# Patient Record
Sex: Male | Born: 1950 | Race: White | Hispanic: No | Marital: Married | Smoking: Current some day smoker
Health system: Southern US, Community
[De-identification: ages and names within clinical notes are randomized; demographics above are authoritative.]

## PROBLEM LIST (undated history)

## (undated) DIAGNOSIS — G5600 Carpal tunnel syndrome, unspecified upper limb: Secondary | ICD-10-CM

## (undated) DIAGNOSIS — E785 Hyperlipidemia, unspecified: Secondary | ICD-10-CM

## (undated) DIAGNOSIS — M25561 Pain in right knee: Secondary | ICD-10-CM

## (undated) DIAGNOSIS — M069 Rheumatoid arthritis, unspecified: Secondary | ICD-10-CM

## (undated) DIAGNOSIS — I2510 Atherosclerotic heart disease of native coronary artery without angina pectoris: Secondary | ICD-10-CM

## (undated) DIAGNOSIS — Z86711 Personal history of pulmonary embolism: Secondary | ICD-10-CM

## (undated) DIAGNOSIS — C3490 Malignant neoplasm of unspecified part of unspecified bronchus or lung: Secondary | ICD-10-CM

## (undated) DIAGNOSIS — I10 Essential (primary) hypertension: Secondary | ICD-10-CM

## (undated) DIAGNOSIS — M25562 Pain in left knee: Secondary | ICD-10-CM

## (undated) DIAGNOSIS — I4891 Unspecified atrial fibrillation: Secondary | ICD-10-CM

## (undated) DIAGNOSIS — G8929 Other chronic pain: Secondary | ICD-10-CM

## (undated) DIAGNOSIS — K219 Gastro-esophageal reflux disease without esophagitis: Secondary | ICD-10-CM

## (undated) DIAGNOSIS — C801 Malignant (primary) neoplasm, unspecified: Secondary | ICD-10-CM

## (undated) DIAGNOSIS — D72829 Elevated white blood cell count, unspecified: Secondary | ICD-10-CM

## (undated) HISTORY — DX: Atherosclerotic heart disease of native coronary artery without angina pectoris: I25.10

## (undated) HISTORY — DX: Essential (primary) hypertension: I10

## (undated) HISTORY — DX: Pain in right knee: M25.561

## (undated) HISTORY — DX: Gastro-esophageal reflux disease without esophagitis: K21.9

## (undated) HISTORY — DX: Elevated white blood cell count, unspecified: D72.829

## (undated) HISTORY — PX: HEMORRHOID SURGERY: SHX153

## (undated) HISTORY — DX: Unspecified atrial fibrillation: I48.91

## (undated) HISTORY — DX: Pain in right knee: M25.562

## (undated) HISTORY — DX: Hyperlipidemia, unspecified: E78.5

## (undated) HISTORY — DX: Personal history of pulmonary embolism: Z86.711

---

## 2002-07-08 ENCOUNTER — Encounter: Payer: Self-pay | Admitting: *Deleted

## 2002-07-08 ENCOUNTER — Emergency Department (HOSPITAL_COMMUNITY): Admission: EM | Admit: 2002-07-08 | Discharge: 2002-07-08 | Payer: Self-pay | Admitting: *Deleted

## 2004-07-26 ENCOUNTER — Emergency Department (HOSPITAL_COMMUNITY): Admission: EM | Admit: 2004-07-26 | Discharge: 2004-07-26 | Payer: Self-pay | Admitting: Emergency Medicine

## 2007-02-02 ENCOUNTER — Emergency Department (HOSPITAL_COMMUNITY): Admission: EM | Admit: 2007-02-02 | Discharge: 2007-02-03 | Payer: Self-pay | Admitting: Emergency Medicine

## 2007-09-13 ENCOUNTER — Emergency Department (HOSPITAL_COMMUNITY): Admission: EM | Admit: 2007-09-13 | Discharge: 2007-09-13 | Payer: Self-pay | Admitting: Family Medicine

## 2009-11-28 ENCOUNTER — Ambulatory Visit: Payer: Self-pay | Admitting: Cardiology

## 2009-11-28 ENCOUNTER — Inpatient Hospital Stay (HOSPITAL_COMMUNITY): Admission: EM | Admit: 2009-11-28 | Discharge: 2009-12-06 | Payer: Self-pay | Admitting: Emergency Medicine

## 2009-11-28 ENCOUNTER — Ambulatory Visit: Payer: Self-pay | Admitting: Internal Medicine

## 2009-11-29 ENCOUNTER — Encounter: Payer: Self-pay | Admitting: Surgery

## 2009-11-29 ENCOUNTER — Ambulatory Visit: Payer: Self-pay | Admitting: Surgery

## 2009-11-29 ENCOUNTER — Ambulatory Visit: Payer: Self-pay | Admitting: Vascular Surgery

## 2009-11-30 HISTORY — PX: CORONARY ARTERY BYPASS GRAFT: SHX141

## 2009-12-02 ENCOUNTER — Encounter (INDEPENDENT_AMBULATORY_CARE_PROVIDER_SITE_OTHER): Payer: Self-pay | Admitting: Internal Medicine

## 2009-12-11 ENCOUNTER — Encounter: Payer: Self-pay | Admitting: Cardiology

## 2009-12-12 ENCOUNTER — Telehealth: Payer: Self-pay | Admitting: Cardiology

## 2009-12-25 DIAGNOSIS — K219 Gastro-esophageal reflux disease without esophagitis: Secondary | ICD-10-CM

## 2009-12-25 DIAGNOSIS — I10 Essential (primary) hypertension: Secondary | ICD-10-CM | POA: Insufficient documentation

## 2010-01-01 ENCOUNTER — Encounter: Payer: Self-pay | Admitting: Cardiology

## 2010-01-02 ENCOUNTER — Ambulatory Visit: Payer: Self-pay | Admitting: Surgery

## 2010-01-02 ENCOUNTER — Ambulatory Visit: Payer: Self-pay | Admitting: Cardiology

## 2010-01-02 ENCOUNTER — Encounter: Admission: RE | Admit: 2010-01-02 | Discharge: 2010-01-02 | Payer: Self-pay | Admitting: Surgery

## 2010-01-02 DIAGNOSIS — I2510 Atherosclerotic heart disease of native coronary artery without angina pectoris: Secondary | ICD-10-CM

## 2010-01-02 DIAGNOSIS — Z9189 Other specified personal risk factors, not elsewhere classified: Secondary | ICD-10-CM | POA: Insufficient documentation

## 2010-01-02 DIAGNOSIS — I4891 Unspecified atrial fibrillation: Secondary | ICD-10-CM

## 2010-01-02 DIAGNOSIS — Z9861 Coronary angioplasty status: Secondary | ICD-10-CM

## 2010-01-02 DIAGNOSIS — Z87891 Personal history of nicotine dependence: Secondary | ICD-10-CM | POA: Insufficient documentation

## 2010-01-08 ENCOUNTER — Telehealth: Payer: Self-pay | Admitting: Cardiology

## 2010-01-15 ENCOUNTER — Telehealth: Payer: Self-pay | Admitting: Cardiology

## 2010-01-19 ENCOUNTER — Telehealth: Payer: Self-pay | Admitting: Cardiology

## 2010-01-26 ENCOUNTER — Telehealth (INDEPENDENT_AMBULATORY_CARE_PROVIDER_SITE_OTHER): Payer: Self-pay | Admitting: *Deleted

## 2010-01-26 ENCOUNTER — Ambulatory Visit: Payer: Self-pay | Admitting: Cardiology

## 2010-01-26 DIAGNOSIS — I10 Essential (primary) hypertension: Secondary | ICD-10-CM | POA: Insufficient documentation

## 2010-01-29 LAB — CONVERTED CEMR LAB
Calcium: 8.8 mg/dL (ref 8.4–10.5)
Chloride: 105 meq/L (ref 96–112)
Creatinine, Ser: 1.3 mg/dL (ref 0.4–1.5)
Glucose, Bld: 112 mg/dL — ABNORMAL HIGH (ref 70–99)
Sodium: 142 meq/L (ref 135–145)

## 2010-02-05 ENCOUNTER — Telehealth: Payer: Self-pay | Admitting: Cardiology

## 2010-02-13 ENCOUNTER — Ambulatory Visit: Payer: Self-pay | Admitting: Cardiology

## 2010-02-13 ENCOUNTER — Telehealth: Payer: Self-pay | Admitting: Cardiology

## 2010-02-15 LAB — CONVERTED CEMR LAB
BUN: 14 mg/dL (ref 6–23)
CO2: 29 meq/L (ref 19–32)
Calcium: 8.7 mg/dL (ref 8.4–10.5)
Chloride: 104 meq/L (ref 96–112)
Creatinine, Ser: 1.2 mg/dL (ref 0.4–1.5)
GFR calc non Af Amer: 65.52 mL/min (ref >60)
Glucose, Bld: 94 mg/dL (ref 70–99)

## 2010-02-21 ENCOUNTER — Telehealth: Payer: Self-pay | Admitting: Cardiology

## 2010-02-23 ENCOUNTER — Telehealth: Payer: Self-pay | Admitting: Cardiology

## 2010-03-01 ENCOUNTER — Ambulatory Visit: Payer: Self-pay | Admitting: Cardiology

## 2010-03-04 LAB — CONVERTED CEMR LAB
CO2: 31 meq/L (ref 19–32)
Creatinine, Ser: 1.3 mg/dL (ref 0.4–1.5)

## 2010-03-08 ENCOUNTER — Encounter: Payer: Self-pay | Admitting: Cardiology

## 2010-03-27 ENCOUNTER — Ambulatory Visit: Payer: Self-pay | Admitting: Cardiology

## 2010-03-27 DIAGNOSIS — E785 Hyperlipidemia, unspecified: Secondary | ICD-10-CM | POA: Insufficient documentation

## 2010-04-09 ENCOUNTER — Telehealth: Payer: Self-pay | Admitting: Cardiology

## 2010-06-11 ENCOUNTER — Ambulatory Visit: Payer: Self-pay | Admitting: Physician Assistant

## 2010-06-11 DIAGNOSIS — M79609 Pain in unspecified limb: Secondary | ICD-10-CM

## 2010-06-11 DIAGNOSIS — M25569 Pain in unspecified knee: Secondary | ICD-10-CM

## 2010-06-13 ENCOUNTER — Ambulatory Visit: Payer: Self-pay | Admitting: Vascular Surgery

## 2010-06-13 ENCOUNTER — Encounter (INDEPENDENT_AMBULATORY_CARE_PROVIDER_SITE_OTHER): Payer: Self-pay | Admitting: Internal Medicine

## 2010-06-13 ENCOUNTER — Encounter: Payer: Self-pay | Admitting: Physician Assistant

## 2010-06-13 ENCOUNTER — Ambulatory Visit (HOSPITAL_COMMUNITY): Admission: RE | Admit: 2010-06-13 | Discharge: 2010-06-13 | Payer: Self-pay | Admitting: Internal Medicine

## 2010-06-13 DIAGNOSIS — D649 Anemia, unspecified: Secondary | ICD-10-CM

## 2010-06-13 LAB — CONVERTED CEMR LAB
AST: 12 units/L (ref 0–37)
BUN: 18 mg/dL (ref 6–23)
Basophils Absolute: 0.2 10*3/uL — ABNORMAL HIGH (ref 0.0–0.1)
Basophils Relative: 1 % (ref 0–1)
CO2: 25 meq/L (ref 19–32)
Creatinine, Ser: 1.22 mg/dL (ref 0.40–1.50)
Eosinophils Absolute: 0.5 10*3/uL (ref 0.0–0.7)
MCHC: 31.9 g/dL (ref 30.0–36.0)
MCV: 87.4 fL (ref 78.0–100.0)
Monocytes Relative: 8 % (ref 3–12)
Neutrophils Relative %: 67 % (ref 43–77)
Potassium: 4.2 meq/L (ref 3.5–5.3)
Sed Rate: 27 mm/hr — ABNORMAL HIGH (ref 0–16)
Sodium: 140 meq/L (ref 135–145)
TSH: 1.289 microintl units/mL (ref 0.350–4.500)
Total Bilirubin: 0.3 mg/dL (ref 0.3–1.2)
Total Protein: 7.2 g/dL (ref 6.0–8.3)
Uric Acid, Serum: 9.3 mg/dL — ABNORMAL HIGH (ref 4.0–7.8)

## 2010-06-15 ENCOUNTER — Encounter: Payer: Self-pay | Admitting: Physician Assistant

## 2010-06-15 LAB — CONVERTED CEMR LAB: Ferritin: 237 ng/mL (ref 22–322)

## 2010-06-21 ENCOUNTER — Encounter (INDEPENDENT_AMBULATORY_CARE_PROVIDER_SITE_OTHER): Payer: Self-pay | Admitting: *Deleted

## 2010-06-21 ENCOUNTER — Encounter: Payer: Self-pay | Admitting: Physician Assistant

## 2010-06-25 ENCOUNTER — Telehealth: Payer: Self-pay | Admitting: Physician Assistant

## 2010-06-25 ENCOUNTER — Ambulatory Visit: Payer: Self-pay | Admitting: Physician Assistant

## 2010-06-26 ENCOUNTER — Telehealth (INDEPENDENT_AMBULATORY_CARE_PROVIDER_SITE_OTHER): Payer: Self-pay | Admitting: *Deleted

## 2010-06-26 LAB — CONVERTED CEMR LAB
BUN: 16 mg/dL (ref 6–23)
CO2: 26 meq/L (ref 19–32)
Calcium: 8.8 mg/dL (ref 8.4–10.5)
Chloride: 104 meq/L (ref 96–112)
Creatinine, Ser: 1.44 mg/dL (ref 0.40–1.50)
Glucose, Bld: 108 mg/dL — ABNORMAL HIGH (ref 70–99)
Potassium: 4.5 meq/L (ref 3.5–5.3)
RBC Folate: 491 ng/mL (ref 180–600)
Sodium: 142 meq/L (ref 135–145)

## 2010-07-02 ENCOUNTER — Ambulatory Visit: Payer: Self-pay | Admitting: Cardiology

## 2010-07-02 ENCOUNTER — Encounter: Payer: Self-pay | Admitting: Physician Assistant

## 2010-07-25 ENCOUNTER — Encounter: Payer: Self-pay | Admitting: Physician Assistant

## 2010-07-25 LAB — CONVERTED CEMR LAB
OCCULT 1: NEGATIVE
OCCULT 2: NEGATIVE
OCCULT 3: NEGATIVE

## 2010-07-27 ENCOUNTER — Encounter: Payer: Self-pay | Admitting: Cardiology

## 2010-07-27 ENCOUNTER — Ambulatory Visit: Payer: Self-pay | Admitting: Physician Assistant

## 2010-07-27 DIAGNOSIS — R82998 Other abnormal findings in urine: Secondary | ICD-10-CM

## 2010-07-27 DIAGNOSIS — B353 Tinea pedis: Secondary | ICD-10-CM | POA: Insufficient documentation

## 2010-07-27 LAB — CONVERTED CEMR LAB
Bilirubin Urine: NEGATIVE
Glucose, Urine, Semiquant: NEGATIVE
Ketones, urine, test strip: NEGATIVE
Protein, U semiquant: NEGATIVE
Urobilinogen, UA: 0.2

## 2010-07-28 ENCOUNTER — Encounter: Payer: Self-pay | Admitting: Physician Assistant

## 2010-08-01 ENCOUNTER — Encounter (INDEPENDENT_AMBULATORY_CARE_PROVIDER_SITE_OTHER): Payer: Self-pay | Admitting: *Deleted

## 2010-08-06 ENCOUNTER — Telehealth: Payer: Self-pay | Admitting: Physician Assistant

## 2010-08-06 LAB — CONVERTED CEMR LAB
Anti Nuclear Antibody(ANA): NEGATIVE
Basophils Absolute: 0.1 10*3/uL (ref 0.0–0.1)
Basophils Relative: 1 % (ref 0–1)
Cholesterol: 148 mg/dL (ref 0–200)
Eosinophils Absolute: 0.5 10*3/uL (ref 0.0–0.7)
HCT: 43.5 % (ref 39.0–52.0)
HDL: 38 mg/dL — ABNORMAL LOW (ref >39)
Hemoglobin: 13.1 g/dL (ref 13.0–17.0)
Lymphocytes Relative: 19 % (ref 12–46)
Lymphs Abs: 3.1 10*3/uL (ref 0.7–4.0)
Monocytes Absolute: 1.2 10*3/uL — ABNORMAL HIGH (ref 0.1–1.0)
Monocytes Relative: 7 % (ref 3–12)
Neutro Abs: 11.5 10*3/uL — ABNORMAL HIGH (ref 1.7–7.7)
PSA: 1.43 ng/mL (ref 0.10–4.00)
Platelets: 347 10*3/uL (ref 150–400)
RBC: 4.67 M/uL (ref 4.22–5.81)
Rhuematoid fact SerPl-aCnc: <20 intl units/mL (ref 0–20)
VLDL: 25 mg/dL (ref 0–40)
WBC: 16.4 10*3/uL — ABNORMAL HIGH (ref 4.0–10.5)

## 2010-08-10 ENCOUNTER — Encounter (INDEPENDENT_AMBULATORY_CARE_PROVIDER_SITE_OTHER): Payer: Self-pay | Admitting: *Deleted

## 2010-08-16 ENCOUNTER — Telehealth: Payer: Self-pay | Admitting: Physician Assistant

## 2010-08-29 ENCOUNTER — Ambulatory Visit: Payer: Self-pay | Admitting: Physician Assistant

## 2010-08-29 ENCOUNTER — Telehealth: Payer: Self-pay | Admitting: Physician Assistant

## 2010-09-03 LAB — CONVERTED CEMR LAB
Basophils Relative: 0 % (ref 0–1)
Eosinophils Absolute: 0.4 10*3/uL (ref 0.0–0.7)
Lymphocytes Relative: 14 % (ref 12–46)
MCHC: 30.2 g/dL (ref 30.0–36.0)
Neutro Abs: 10.3 10*3/uL — ABNORMAL HIGH (ref 1.7–7.7)
RBC: 4.57 M/uL (ref 4.22–5.81)
RDW: 14.5 % (ref 11.5–15.5)
WBC: 13.3 10*3/uL — ABNORMAL HIGH (ref 4.0–10.5)

## 2010-09-18 ENCOUNTER — Ambulatory Visit: Payer: Self-pay | Admitting: Family Medicine

## 2010-09-18 DIAGNOSIS — M2340 Loose body in knee, unspecified knee: Secondary | ICD-10-CM | POA: Insufficient documentation

## 2010-09-18 DIAGNOSIS — M2390 Unspecified internal derangement of unspecified knee: Secondary | ICD-10-CM | POA: Insufficient documentation

## 2010-09-22 ENCOUNTER — Encounter: Admission: RE | Admit: 2010-09-22 | Discharge: 2010-09-22 | Payer: Self-pay | Admitting: Family Medicine

## 2010-09-26 ENCOUNTER — Ambulatory Visit: Payer: Self-pay | Admitting: Physician Assistant

## 2010-09-28 ENCOUNTER — Telehealth: Payer: Self-pay | Admitting: Physician Assistant

## 2010-09-28 LAB — CONVERTED CEMR LAB
Basophils Absolute: 0.1 10*3/uL (ref 0.0–0.1)
Basophils Relative: 0 % (ref 0–1)
Eosinophils Absolute: 0.5 10*3/uL (ref 0.0–0.7)
Hemoglobin: 12.8 g/dL — ABNORMAL LOW (ref 13.0–17.0)
MCHC: 30.3 g/dL (ref 30.0–36.0)
MCV: 91.1 fL (ref 78.0–100.0)
Monocytes Absolute: 1 10*3/uL (ref 0.1–1.0)
RDW: 14.7 % (ref 11.5–15.5)
WBC: 14.6 10*3/uL — ABNORMAL HIGH (ref 4.0–10.5)

## 2010-10-01 ENCOUNTER — Ambulatory Visit: Payer: Self-pay | Admitting: Oncology

## 2010-10-01 ENCOUNTER — Ambulatory Visit: Payer: Self-pay | Admitting: Physician Assistant

## 2010-10-01 DIAGNOSIS — R609 Edema, unspecified: Secondary | ICD-10-CM

## 2010-10-01 LAB — CONVERTED CEMR LAB
ALT: 10 units/L (ref 0–53)
Albumin: 3.7 g/dL (ref 3.5–5.2)
Alkaline Phosphatase: 63 units/L (ref 39–117)
Calcium: 9.3 mg/dL (ref 8.4–10.5)
Creatinine, Ser: 1.24 mg/dL (ref 0.40–1.50)
Potassium: 4.6 meq/L (ref 3.5–5.3)
Sodium: 143 meq/L (ref 135–145)
TSH: 0.927 microintl units/mL (ref 0.350–4.500)
Total Bilirubin: 0.3 mg/dL (ref 0.3–1.2)

## 2010-10-02 ENCOUNTER — Encounter: Payer: Self-pay | Admitting: Physician Assistant

## 2010-10-08 ENCOUNTER — Ambulatory Visit: Payer: Self-pay | Admitting: Internal Medicine

## 2010-10-08 ENCOUNTER — Encounter (INDEPENDENT_AMBULATORY_CARE_PROVIDER_SITE_OTHER): Payer: Self-pay | Admitting: *Deleted

## 2010-10-12 ENCOUNTER — Encounter (INDEPENDENT_AMBULATORY_CARE_PROVIDER_SITE_OTHER): Payer: Self-pay | Admitting: Internal Medicine

## 2010-10-12 LAB — CBC WITH DIFFERENTIAL/PLATELET
Basophils Absolute: 0.2 10*3/uL — ABNORMAL HIGH (ref 0.0–0.1)
EOS%: 4.3 % (ref 0.0–7.0)
Eosinophils Absolute: 0.5 10*3/uL (ref 0.0–0.5)
HCT: 37.8 % — ABNORMAL LOW (ref 38.4–49.9)
LYMPH%: 16.7 % (ref 14.0–49.0)
MCH: 29.5 pg (ref 27.2–33.4)
MCHC: 33.8 g/dL (ref 32.0–36.0)
MCV: 87.2 fL (ref 79.3–98.0)
MONO#: 0.8 10*3/uL (ref 0.1–0.9)
Platelets: 314 10*3/uL (ref 140–400)
RBC: 4.34 10*6/uL (ref 4.20–5.82)
RDW: 15.1 % — ABNORMAL HIGH (ref 11.0–14.6)
WBC: 11.3 10*3/uL — ABNORMAL HIGH (ref 4.0–10.3)
lymph#: 1.9 10*3/uL (ref 0.9–3.3)

## 2010-10-12 LAB — COMPREHENSIVE METABOLIC PANEL: Chloride: 105 mEq/L (ref 96–112)

## 2010-10-24 ENCOUNTER — Telehealth (INDEPENDENT_AMBULATORY_CARE_PROVIDER_SITE_OTHER): Payer: Self-pay | Admitting: Internal Medicine

## 2010-10-24 ENCOUNTER — Ambulatory Visit: Payer: Self-pay | Admitting: Cardiovascular Disease

## 2010-11-15 ENCOUNTER — Telehealth (INDEPENDENT_AMBULATORY_CARE_PROVIDER_SITE_OTHER): Payer: Self-pay | Admitting: Internal Medicine

## 2010-11-15 DIAGNOSIS — D72829 Elevated white blood cell count, unspecified: Secondary | ICD-10-CM | POA: Insufficient documentation

## 2011-01-01 ENCOUNTER — Ambulatory Visit: Admit: 2011-01-01 | Payer: Self-pay | Admitting: Internal Medicine

## 2011-01-04 ENCOUNTER — Ambulatory Visit: Payer: Self-pay | Admitting: Oncology

## 2011-01-06 ENCOUNTER — Encounter: Payer: Self-pay | Admitting: Surgery

## 2011-01-13 LAB — CONVERTED CEMR LAB
Folate: 4.2 ng/mL
Saturation Ratios: 10 % — ABNORMAL LOW (ref 20–55)
TIBC: 318 ug/dL (ref 215–435)
UIBC: 285 ug/dL

## 2011-01-15 NOTE — Letter (Signed)
Summary: [facility]  [facility]   Imported By: Marylou Mccoy 05/17/2010 16:20:32  _____________________________________________________________________  External Attachment:    Type:   Image     Comment:   External Document

## 2011-01-15 NOTE — Progress Notes (Signed)
Summary: DIARRHEA FOR 3 DAYS  Phone Note Call from Patient   Summary of Call: Pt called back and informed of last labs and stated he is having diarrhea for the past two days, but not in August when blood was drawn.  Not taking anything for it.  Lab appt made for 08-21-10 Pt can be reached at  [phone] Initial call taken by: Vesta Mixer CMA,  August 16, 2010 11:48 AM  Follow-up for Phone Call        Clear liquids for 24 hours, advance as tolerated with bland diet (BRAT - bananas, rice, apples, toast) over 1-2 days. Appt or ED if high fevers, bloody diarrhea, etc.  Hold off on repeat CBC for 2 weeks. Make sure CBC with diff and send to pathology for smear review. Tereso Newcomer PA-C  August 16, 2010 2:13 PM   Additional Follow-up for Phone Call Additional follow up Details #1::        pt is aware..... [name]  August 16, 2010 3:53 PM

## 2011-01-15 NOTE — Assessment & Plan Note (Signed)
Summary: Knee Pain   Vital Signs:  Patient profile:   60 year old male Height:      67 inches Weight:      221 pounds BMI:     34.74 Temp:     97.1 degrees F oral Pulse rate:   70 / minute Pulse rhythm:   regular Resp:     18 per minute BP sitting:   110 / 80  (left arm) Cuff size:   large  Vitals Entered By: [name] (August 29, 2010 10:04 AM) CC: referral for sports med.... pt did not bring meds... Is Patient Diabetic? No Pain Assessment Patient in pain? no       Does patient need assistance? Functional Status Self care Ambulation Normal   Primary Care Provider:  Tereso Newcomer, PA-C  CC:  referral for sports med.... pt did not bring meds....  History of Present Illness: Here for referral to [facility] clinic. Did not get phone call for appt last time. No change in knee pain. Pain controlled well. Needs repeat CBC.  Problems Prior to Update: 1)  Tinea Pedis  (ICD-110.4) 2)  Preventive Health Care  (ICD-V70.0) 3)  Urinalysis, Abnormal  (ICD-791.9) 4)  Anemia, Mild  (ICD-285.9) 5)  Leukocytosis  (ICD-288.60) 6)  Leg Pain  (ICD-729.5) 7)  Knee Pain, Bilateral  (ICD-719.46) 8)  Family History Diabetes 1st Degree Relative  (ICD-V18.0) 9)  Dyslipidemia  (ICD-272.4) 10)  Encounter For Long-term Use of Other Medications  (ICD-V58.69) 11)  Essential Hypertension, Benign  (ICD-401.1) 12)  Cad  (ICD-414.00) 13)  Unspec Personal Hx Presenting Hazards Health  (ICD-V15.9) 14)  Tobacco Use, Quit  (ICD-V15.82) 15)  Atrial Fibrillation  (ICD-427.31) 16)  Gerd  (ICD-530.81) 17)  Hypertension  (ICD-401.9)  Current Medications (verified): 1)  Metoprolol Tartrate 25 Mg Tabs (Metoprolol Tartrate) .... One Tablet Twice A Day 2)  Aspirin 325 Mg  Tabs (Aspirin) .Marland Kitchen.. 1 By Mouth Daily 3)  Lisinopril 40 Mg Tabs (Lisinopril) .... One Daily 4)  Pravastatin Sodium 20 Mg Tabs (Pravastatin Sodium) .... One At Bedtime 5)  Hydrochlorothiazide 25 Mg Tabs (Hydrochlorothiazide) .Marland Kitchen.. 1 By  Mouth Once Daily 6)  Norvasc 5 Mg Tabs (Amlodipine Besylate) .... Take 1 Tablet By Mouth Once A Day For Blood Pressure 7)  Ferrous Sulfate 325 (65 Fe) Mg Tabs (Ferrous Sulfate) .... Take 1 Tablet By Mouth Once Daily 8)  Naprosyn 500 Mg Tabs (Naproxen) .... Take 1 Tablet By Mouth Two Times A Day As Needed For Pain 9)  Clotrimazole 1 % Crea (Clotrimazole) .... Apply To Foot Two Times A Day For 3-4 Weeks Until Clear  Allergies (verified): No Known Drug Allergies  Review of Systems Heme:  Complains of abnormal bruising; denies bleeding and fevers.  Physical Exam  General:  alert, well-developed, and well-nourished.   Head:  normocephalic and atraumatic.   Lungs:  normal breath sounds.   Heart:  normal rate and regular rhythm.   Msk:  bilat knees: + crepitus full rom ? eff on left  Neurologic:  alert & oriented X3 and cranial nerves II-XII intact.     Impression & Recommendations:  Problem # 1:  LEUKOCYTOSIS (ICD-288.60)  Orders: T-CBC w/Diff (16109-60454)  Problem # 2:  KNEE PAIN, BILATERAL (ICD-719.46) arrange appt with [facility]  His updated medication list for this problem includes:    Aspirin 325 Mg Tabs (Aspirin) .Marland Kitchen... 1 by mouth daily    Naprosyn 500 Mg Tabs (Naproxen) .Marland Kitchen... Take 1 tablet by mouth two times  a day as needed for pain  Complete Medication List: 1)  Metoprolol Tartrate 25 Mg Tabs (Metoprolol tartrate) .... One tablet twice a day 2)  Aspirin 325 Mg Tabs (Aspirin) .Marland Kitchen.. 1 by mouth daily 3)  Lisinopril 40 Mg Tabs (Lisinopril) .... One daily 4)  Pravastatin Sodium 20 Mg Tabs (Pravastatin sodium) .... One at bedtime 5)  Hydrochlorothiazide 25 Mg Tabs (Hydrochlorothiazide) .Marland Kitchen.. 1 by mouth once daily 6)  Norvasc 5 Mg Tabs (Amlodipine besylate) .... Take 1 tablet by mouth once a day for blood pressure 7)  Ferrous Sulfate 325 (65 Fe) Mg Tabs (Ferrous sulfate) .... Take 1 tablet by mouth once daily 8)  Naprosyn 500 Mg Tabs (Naproxen) .... Take 1 tablet by mouth two  times a day as needed for pain 9)  Clotrimazole 1 % Crea (Clotrimazole) .... Apply to foot two times a day for 3-4 weeks until clear  Patient Instructions: 1)  Someone will call you for the sports medicine clinic. 2)  Follow up as scheduled.

## 2011-01-15 NOTE — Letter (Signed)
Summary: *HSN Results Follow up  [facility]  [address] [place], [place] [address]   Phone: [phone]  Fax: [phone]      10/02/2010   HENDRIXX SEVERIN Toney [address] [place], [place]  [address]   Dear  Mr. MYKA LUKINS,                            ____S.Drinkard,FNP   ____D. Gore,FNP       ____B. McPherson,MD   ____V. Rankins,MD    ____E. Mulberry,MD    ____N. Daphine Deutscher, FNP  ____D. Reche Dixon, MD    ____K. Philipp Deputy, MD    __x__S. Alben Spittle, PA-C     This letter is to inform you that your recent test(s):  _______Pap Smear    ___x____Lab Test     _______X-ray    ___x____ is within acceptable limits  _______ requires a medication change  _______ requires a follow-up lab visit  _______ requires a follow-up visit with your provider   Comments: Kidney, liver and thyroid function all normal.       _________________________________________________________ If you have any questions, please contact our office                     Sincerely,  Tereso Newcomer PA-C [facility]

## 2011-01-15 NOTE — Assessment & Plan Note (Signed)
Summary: eph/per dr Toben Acuna   Visit Type:  Follow-up  CC:  CAD.  History of Present Illness: The patient presents for followup after management of acute coronary syndrome and subsequent CABG. He had a well-preserved ejection fraction of 55% at that time. Since his surgery he has done well. He has had a mild cough slightly productive of brown sputum but no fevers or chills. This he had prior to surgery. He has had some continued incisional chest discomfort but this is slowly improving. He has had none of this substernal discomfort that he had at the time of his coronary event. He has had no PND or orthopnea. He has had no palpitations, presyncope or syncope.  He did see Dr.Bartle and had a chest x-ray. He has been released.  Current Medications (verified): 1)  Hydrocodone-Acetaminophen 7.5-500 Mg Tabs (Hydrocodone-Acetaminophen) .... 4-6 Hrs As Needed 2)  Toprol Xl 25 Mg Xr24h-Tab (Metoprolol Succinate) .... 1/2 Tab Two Times A Day 3)  Cvs Acid Reducer Max St 150 Mg Tabs (Ranitidine Hcl) .... Once Daily in A.m. 4)  Aspirin 81 Mg Tbec (Aspirin) .... Take One Tablet By Mouth Daily  Allergies (verified): No Known Drug Allergies  Past History:  Past Medical History: Significant for hypertension.   Gastroesophageal reflux.   Pulmonary embolism years ago   Atrial fibrillation  Past Surgical History: CABG x5 (LIMA to LAD, SVG to diagonal branch of the LAD, SVG     sequentially to OM-2 and 3, SVG to the distal RCA with EVH from the     right leg by Dr. Laneta Simmers on November 30, 2009).    Review of Systems       Mild cough.  Otherwise as stated in the HPI and negative for all other systems.   Vital Signs:  Patient profile:   60 year old male Height:      67 inches Weight:      208 pounds BMI:     32.70 Pulse rate:   58 / minute BP sitting:   184 / 95  (right arm) Cuff size:   large  Vitals Entered By: Oswald Hillock (January 02, 2010 4:17 PM)  Physical Exam  General:   Well developed, well nourished, in no acute distress. Head:  normocephalic and atraumatic Eyes:  PERRLA/EOM intact; conjunctiva and lids normal. Mouth:  Gums and palate normal. Oral mucosa normal. Neck:  Neck supple, no JVD. No masses, thyromegaly or abnormal cervical nodes. Chest Wall:  well healed sternotomy scar Lungs:  Clear bilaterally to auscultation and percussion. Abdomen:  Bowel sounds positive; abdomen soft and non-tender without masses, organomegaly, or hernias noted. No hepatosplenomegaly. Msk:  Back normal, normal gait. Muscle strength and tone normal. Extremities:  No clubbing or cyanosis. Neurologic:  Alert and oriented x 3. Skin:  Intact without lesions or rashes. Cervical Nodes:  no significant adenopathy Axillary Nodes:  no significant adenopathy Inguinal Nodes:  no significant adenopathy Psych:  Normal affect.   Detailed Cardiovascular Exam  Neck    Carotids: Carotids full and equal bilaterally without bruits.      Neck Veins: Normal, no JVD.    Heart    Inspection: no deformities or lifts noted.      Palpation: normal PMI with no thrills palpable.      Auscultation: regular rate and rhythm, S1, S2 without murmurs, rubs, gallops, or clicks.    Vascular    Abdominal Aorta: no palpable masses, pulsations, or audible bruits.      Femoral Pulses: normal  femoral pulses bilaterally.      Pedal Pulses: normal pedal pulses bilaterally.      Radial Pulses: normal radial pulses bilaterally.      Peripheral Circulation: no clubbing, cyanosis, or edema noted with normal capillary refill.     EKG  Procedure date:  01/02/2010  Findings:      sinus rhythm, rate 58, axis within normal limits, old inferior infarct, anterolateral T wave inversions without old EKGs for comparison.  Impression & Recommendations:  Problem # 1:  CAD (ICD-414.00) The patient is status post CABG. He is doing well. He will continue with the meds as listed.  Problem # 2:  TOBACCO USE, QUIT  (ICD-V15.82) He has completely abstain from cigarettes and surgery! I applaud this and encouraged continued abstinence.  Problem # 3:  ATRIAL FIBRILLATION (ICD-427.31) He has had no further paroxysms of this. I think he can discontinue the amiodarone and digoxin. Orders: EKG w/ Interpretation (93000)  Problem # 4:  HYPERTENSION (ICD-401.9) His blood pressure is controlled. He can continue the meds as listed.  Patient Instructions: 1)  Your physician recommends that you schedule a follow-up appointment in: 4 months with Dr Antoine Poche 2)  Your physician has recommended you make the following change in your medication: Stop Digoxin and Aminodarone Prescriptions: HYDROCODONE-ACETAMINOPHEN 7.5-500 MG TABS (HYDROCODONE-ACETAMINOPHEN) 4-6 hrs as needed  #30 x 0   Entered by:   Charolotte Capuchin, RN   Authorized by:   Rollene Rotunda, MD, [facility]   Signed by:   Charolotte Capuchin, RN on 01/02/2010   Method used:   Print then Give to Patient   RxID:   [id number]

## 2011-01-15 NOTE — Letter (Signed)
Summary: [facility]  [facility]   Imported By: Arta Bruce 11/16/2010 10:31:27  _____________________________________________________________________  External Attachment:    Type:   Image     Comment:   External Document

## 2011-01-15 NOTE — Progress Notes (Signed)
  Request Recieved from DDS sent to [facility] ] [facility]  June 26, 2010 3:03 PM

## 2011-01-15 NOTE — Progress Notes (Signed)
Summary: Metoprolol   Phone Note Call from Patient Call back at Home Phone [phone]   Caller: Spouse/Connie Summary of Call: Pt wife is calling regarding the medication Metoprolol ,it's making the pt have headachs and b/p issues. Initial call taken by: Judie Grieve,  January 19, 2010 9:24 AM  Follow-up for Phone Call        I spoke with the pt's wife and she said the pt is taking Metoprolol ER 25mg  two times a day and he feels tired.  The pt is also having  diarrhea, light headaches and pain in right side of chest (same pain  as previously described).  The pt's wife has noticed that the pt's  BP is fluctuating.  BP last night was 170/90 and BP earlier in the week was 140/80.  The pt's wife picked up the Rx for Lisinopril 5mg  daily last night.  I instructed the pt's wife that he should start this medication as directed.  The pt should continue monitoring his BP and I instructed the pt's wife on how to check the pt's pulse.  I told her the pt's symptoms should improve as the BP decreases.  The pt's wife is aware that we would like the pt's BP around 140/90 or below and heart rate around 55 and above.  The pt's wife will call back with any other questions or concerns. Follow-up by: Julieta Gutting, RN, BSN,  January 19, 2010 9:45 AM

## 2011-01-15 NOTE — Assessment & Plan Note (Signed)
Summary: HTN; edema   Vital Signs:  Patient profile:   60 year old male Height:      67 inches Weight:      227.1 pounds BMI:     35.70 Temp:     97.0 degrees F oral Pulse rate:   70 / minute Pulse rhythm:   regular Resp:     18 per minute BP sitting:   116 / 70  (left arm) Cuff size:   large  Vitals Entered By: [name] (October 01, 2010 2:18 PM) CC: three month f/u.....Marland Kitchen pt says his hands and feet are swelling.... pt wants a understanding why he needs to see heme...., Hypertension Management Is Patient Diabetic? No Pain Assessment Patient in pain? no       Does patient need assistance? Functional Status Self care Ambulation Normal   Primary Care Mearl Olver:  Tereso Newcomer, PA-C  CC:  three month f/u.....Marland Kitchen pt says his hands and feet are swelling.... pt wants a understanding why he needs to see heme.... and Hypertension Management.  History of Present Illness: Here for f/u.  Notes ankle swelling.  Did not really notice until last few weeks.  Amlodipine started in late July.    Leukocytosis:  No clear reason for elevated WBC.  Heme appt pending.   Hypertension History:      He complains of peripheral edema, but denies headache, chest pain, dyspnea with exertion, orthopnea, PND, and syncope.        Positive major cardiovascular risk factors include male age 52 years old or older, hyperlipidemia, and hypertension.        Positive history for target organ damage include ASHD (either angina/prior MI/prior CABG).     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.9) 4)  Tinea Pedis  (ICD-110.4) 5)  Preventive Health Care  (ICD-V70.0) 6)  Urinalysis, Abnormal  (ICD-791.9) 7)  Anemia, Mild  (ICD-285.9) 8)  Leukocytosis  (ICD-288.60) 9)  Leg Pain  (ICD-729.5) 10)  Knee Pain, Bilateral  (ICD-719.46) 11)  Family History Diabetes 1st Degree Relative  (ICD-V18.0) 12)  Dyslipidemia   (ICD-272.4) 13)  Encounter For Long-term Use of Other Medications  (ICD-V58.69) 14)  Essential Hypertension, Benign  (ICD-401.1) 15)  Cad  (ICD-414.00) 16)  Unspec Personal Hx Presenting Hazards Health  (ICD-V15.9) 17)  Tobacco Use, Quit  (ICD-V15.82) 18)  Atrial Fibrillation  (ICD-427.31) 19)  Gerd  (ICD-530.81) 20)  Hypertension  (ICD-401.9)  Current Medications (verified): 1)  Metoprolol Tartrate 25 Mg Tabs (Metoprolol Tartrate) .... One Tablet Twice A Day 2)  Aspirin 325 Mg  Tabs (Aspirin) .Marland Kitchen.. 1 By Mouth Daily 3)  Lisinopril 40 Mg Tabs (Lisinopril) .... One Daily 4)  Pravastatin Sodium 20 Mg Tabs (Pravastatin Sodium) .... One At Bedtime 5)  Hydrochlorothiazide 25 Mg Tabs (Hydrochlorothiazide) .Marland Kitchen.. 1 By Mouth Once Daily 6)  Norvasc 5 Mg Tabs (Amlodipine Besylate) .... Take 1 Tablet By Mouth Once A Day For Blood Pressure 7)  Naprosyn 500 Mg Tabs (Naproxen) .... Take 1 Tablet By Mouth Two Times A Day As Needed For Pain 8)  Clotrimazole 1 % Crea (Clotrimazole) .... Apply To Foot Two Times A Day For 3-4 Weeks Until Clear  Allergies (verified): No Known Drug Allergies  Past History:  Past Medical History: Reviewed history from 06/15/2010 and no changes required. Hypertension. Dyslipidemia   Gastroesophageal reflux.   Pulmonary embolism years ago   post op  Atrial fibrillation Coronary artery disease   a.  S/p NSTEMI 12.2010 Good LVF Bilat knee pain   a.  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   Head:  normocephalic and atraumatic.   Neck:  supple.  no jvd at 90 degrees Lungs:  normal breath sounds, no crackles, and no wheezes.   Heart:  normal rate, regular rhythm, and no gallop.   Abdomen:  soft.   Pulses:  R posterior tibial normal, R dorsalis pedis normal, L posterior tibial normal, and L dorsalis pedis normal.   Extremities:  1+ ankle edema bilat Neurologic:  alert & oriented  X3 and cranial nerves II-XII intact.   Psych:  normally interactive.     Impression & Recommendations:  Problem # 1:  EDEMA (ICD-782.3)  related to norvasc? hold med return for bp check in one week: A.  if edema resolved, adjust meds depending on BP (BP optimal since norvasc started) B.  if no change, restart norvasc and change HCTZ to furosemide   His updated medication list for this problem includes:    Hydrochlorothiazide 25 Mg Tabs (Hydrochlorothiazide) .Marland Kitchen... 1 by mouth once daily  Orders: T-Comprehensive Metabolic Panel ([address]109-60454) T-TSH ([id number])  Problem # 2:  HYPERTENSION (ICD-401.9) controlled  His updated medication list for this problem includes:    Metoprolol Tartrate 25 Mg Tabs (Metoprolol tartrate) ..... One tablet twice a day    Lisinopril 40 Mg Tabs (Lisinopril) ..... One daily    Hydrochlorothiazide 25 Mg Tabs (Hydrochlorothiazide) .Marland Kitchen... 1 by mouth once daily    Norvasc 5 Mg Tabs (Amlodipine besylate) .Marland Kitchen... Take 1 tablet by mouth once a day for blood pressure  Orders: T-Comprehensive Metabolic Panel ([id number])  Problem # 3:  LEUKOCYTOSIS (ICD-288.60) heme referral pending  Complete Medication List: 1)  Metoprolol Tartrate 25 Mg Tabs (Metoprolol tartrate) .... One tablet twice a day 2)  Aspirin 325 Mg Tabs (Aspirin) .Marland Kitchen.. 1 by mouth daily 3)  Lisinopril 40 Mg Tabs (Lisinopril) .... One daily 4)  Pravastatin Sodium 20 Mg Tabs (Pravastatin sodium) .... One at bedtime 5)  Hydrochlorothiazide 25 Mg Tabs (Hydrochlorothiazide) .Marland Kitchen.. 1 by mouth once daily 6)  Norvasc 5 Mg Tabs (Amlodipine besylate) .... Take 1 tablet by mouth once a day for blood pressure 7)  Naprosyn 500 Mg Tabs (Naproxen) .... Take 1 tablet by mouth two times a day as needed for pain 8)  Clotrimazole 1 % Crea (Clotrimazole) .... Apply to foot two times a day for 3-4 weeks until clear  Hypertension Assessment/Plan:      The patient's hypertensive risk group is category C: Target organ  damage and/or diabetes.  Today's blood pressure is 116/70.  His blood pressure goal is < 140/90.  Patient Instructions: 1)  Stop the Norvasc (Amlodipine). 2)  Return for nurse visit in one week for BP check.  Notify Angelo Prindle of BP and see instructions in last OV note from 10/01/2010. 3)  Keep legs propped up when not standing or walking. 4)  Watch your salt. 5)  Schedule follow up with Joreen Swearingin in 3 months for blood pressure.   Orders Added: 1)  Est. Patient Level III [57846] 2)  T-Comprehensive Metabolic Panel [[id number]] 3)  T-TSH [[id number]]

## 2011-01-15 NOTE — Progress Notes (Signed)
Summary: pt having trouble with b/p meds/  lm to cb  Phone Note Call from Patient Call back at [phone]   Caller: Spouse Reason for Call: Talk to Nurse, Talk to Doctor Summary of Call: pt wants to go back on other b/p meds cause the new one is not doing as good as the other one Initial call taken by: Omer Jack,  February 05, 2010 12:28 PM  Follow-up for Phone Call        BP NOT GETTING ANY BETTER HAS BEEN UP AND DOWN  150/80 AND BEFORE THAT 150/90. TAKING METOPROLOL ER 25MG   IS  MAKING HIM MORE TIRED.  FEEL BETTER ON THE TARTRATE 25 MG TWICE A DAY.    STILL TAKING LISINOPRIL 10 MG DAILY.  BP 150/90, 170/94, 170/85, 180/100 TO  150/80 IS THE RANGE.   WIFE AND PT THINK HIS MEDICATIONS NEED TO BE CHANGED.  WILL NOTIFY DR [facility] AND CALL PT BACK WITH ORDERS   Per pt wife calling back to speak with pam [phone] Lorne Skeens  February 06, 2010 10:54 AM  Follow-up by: Charolotte Capuchin, RN,  February 05, 2010 2:15 PM  Additional Follow-up for Phone Call Additional follow up Details #1::        Change to tartrate 25 mg  two times a day IR.  Increase linsinopril to 50 mg daily. Additional Follow-up by: Rollene Rotunda, MD, [facility],  February 05, 2010 10:40 PM    Additional Follow-up for Phone Call Additional follow up Details #2::    Called patient and left message on machine for wife to call back   per vo Rollene Rotunda, MD, Lisinopril should be 40 mg a day. Pam Fleming-Hayes,RN  pt's wife aware of medication changes, rx [facility] [address] Follow-up by: Charolotte Capuchin, RN,  February 06, 2010 11:41 AM  New/Updated Medications: METOPROLOL TARTRATE 25 MG TABS (METOPROLOL TARTRATE) one tablet twice a day LISINOPRIL 40 MG TABS (LISINOPRIL) one daily Prescriptions: METOPROLOL TARTRATE 25 MG TABS (METOPROLOL TARTRATE) one tablet twice a day  #60 x 11   Entered by:   Charolotte Capuchin, RN   Authorized by:   Rollene Rotunda, MD, [facility]   Signed by:   Charolotte Capuchin, RN on 02/06/2010   Method used:   Electronically to        [facility] (retail)       [address]       [place], [place]  [address]       Ph: [phone]       Fax: [phone]   RxID:   [id number] LISINOPRIL 40 MG TABS (LISINOPRIL) one daily  #30 x 11   Entered by:   Charolotte Capuchin, RN   Authorized by:   Rollene Rotunda, MD, [facility]   Signed by:   Charolotte Capuchin, RN on 02/06/2010   Method used:   Electronically to        [facility] (retail)       [address]       [place], [place]  [address]       Ph: [phone]       Fax: [phone]   RxID:   [id number]

## 2011-01-15 NOTE — Progress Notes (Signed)
Summary: Edema  Phone Note Other Incoming   Summary of Call: Pt wife called. Pt has had edema in hands and feet. No shortness of breath. Has questions about meds. Meds confirmed. Pt wants Dr. Antoine Poche to call to follow up. No pcp wants refill on hydrocodone. Will ask Dr. Antoine Poche and Elita Quick to review. Marrion Coy, CNA  February 21, 2010 11:00 AM  Initial call taken by: Marrion Coy, CNA,  February 21, 2010 10:59 AM  Follow-up for Phone Call        swelling started yesterday left hand hurt a thumb and right hand at one finger.  Pain medication helps it.  swelling at feet is not bad, no SOB, trouble breath.  Having pain in the top of his chest depending on how he sleeps.  Last about 1/2 to 45 mins - goes away on its own.  He can lay on his back only if he lays on his side more than 45 mins it hurts.  Requested pt use tylenol for pain, watch swelling and avoid added salt processed foods and canned foods.  Pt states understanding.  Sander Nephew, RN Follow-up by: Marrion Coy, CNA,  February 21, 2010 10:57 AM

## 2011-01-15 NOTE — Letter (Signed)
Summary: *HSN Results Follow up  [facility]  [address] [place], [place] [address]   Phone: [phone]  Fax: [phone]      08/10/2010   Clinton Baldwin [address] [place], [place]  [address]   Dear  Mr. ZAYQUAN BOGARD,                            ____S.Drinkard,FNP   ____D. Gore,FNP       ____B. McPherson,MD   ____V. Rankins,MD    ____E. Mulberry,MD    ____N. Daphine Deutscher, FNP  ____D. Reche Dixon, MD    ____K. Philipp Deputy, MD    ____Other     This letter is to inform you that your recent test(s):  _______Pap Smear    __X_____Lab Test     _______X-ray    __X_____ is within acceptable limits  _______ requires a medication change  ____X___ requires a follow-up lab visit  _______ requires a follow-up visit with your provider   Comments:  We have been trying to reach you.  Please give the office a call at your earliest convenience.       _________________________________________________________ If you have any questions, please contact our office                     Sincerely,  [name] [facility]

## 2011-01-15 NOTE — Assessment & Plan Note (Signed)
Summary: blood pressure and ekg per Dr Tenny Craw  Nurse Visit   Impression & Recommendations:  Problem # 1:  ESSENTIAL HYPERTENSION, BENIGN (ICD-401.1)  His updated medication list for this problem includes:    Metoprolol Tartrate 25 Mg Tabs (Metoprolol tartrate) ..... One tablet twice a day    Aspirin 81 Mg Tbec (Aspirin) .Marland Kitchen... Take one tablet by mouth daily    Lisinopril 40 Mg Tabs (Lisinopril) ..... One daily Pt in for B/P check. B/P was taken manualy right arm 150/94, Left arm 156/94. B/P taken with b/p machine R. arm 166/89, L. arm 171/95 pulse 55 beats/min. Pt. took medication this AM. EKG done. it was given to [facility] Dr. Jenene Slicker nurse for MD to review. Hardin Negus RN aware of B/P reading. RN let pt know Pam Dr. Jenene Slicker nurse will call pt. after MD review B/P reading EKG and labs results. Pt D/c home in satisfactory condition.   Allergies: No Known Drug Allergies

## 2011-01-15 NOTE — Progress Notes (Signed)
Summary: GI referral/Hematology update  Phone Note Outgoing Call   Summary of Call: Recommended by Hematology that pt. undergo Colon cancer screening.  No further work up of mildly elevated WBC and borderline low hemoglobin at this time.  Please call Clinton Baldwin and set up please--not clear if he ever went to GI when referred in August as a mix up with appts.  If this did occure, just need GI notes.  Send copy of Hematology report with referral  Initial call taken by: Julieanne Manson MD,  November 15, 2010 10:51 AM  Follow-up for Phone Call        HE HAD AN APPT 09-13-10 WITH [facility]  I MAILED HIM A LETTER AND Clinton Baldwin CALL [facility] AN D   RESCHEDULE TO  10-24-10 AND HE NOS SO HE CALL AGAIN TO RESCHEDULE AN APPT  HIS FINAL APPT IS 12-05-10 @ 9;30AM AND IF HE NOS THEY WON'T GIVE HIM A ANOTHER APPT .   Follow-up by: Cheryll Dessert,  November 16, 2010 10:06 AM  New Problems: LEUKOCYTOSIS (ICD-288.60)   New Problems: LEUKOCYTOSIS (ICD-288.60)

## 2011-01-15 NOTE — Assessment & Plan Note (Signed)
Summary: 4 month rov/sl   Visit Type:  Follow-up Primary Provider:  None   CC:  CAD.  History of Present Illness: The patient presents for followup of his known coronary disease.  Since I last saw him he has been doing relatively well. He had a cough which resolved. His blood pressure was difficult to control but that seems to be better.  However, he's had some joint swelling in his hands and in his ankles.  He he is back to work part-time but is not walking. He denies any chest pressure, neck or arm discomfort. He's had no palpitations, presyncope or syncope. He's had no PND or orthopnea.   Current Medications (verified): 1)  Metoprolol Tartrate 25 Mg Tabs (Metoprolol Tartrate) .... One Tablet Twice A Day 2)  [facility]vs Acid Reducer Max St 150 Mg Tabs (Ranitidine Hcl) .... Once Daily in A.m. 3)  Aspirin 325 Mg  Tabs (Aspirin) .Marland Kitchen.. 1 By Mouth Daily 4)  Lisinopril 40 Mg Tabs (Lisinopril) .... One Daily 5)  Hydrochlorothiazide 12.5 Mg Caps (Hydrochlorothiazide) .Marland Kitchen.. 1 By Mouth Daily  Allergies (verified): No Known Drug Allergies  Past History:  Past Medical History: Hypertension.   Gastroesophageal reflux.   Pulmonary embolism years ago   Atrial fibrillation Coronary artery disease  Past Surgical History: Reviewed history from 01/02/2010 and no changes required. CABG x5 (LIMA to LAD, SVG to diagonal branch of the LAD, SVG     sequentially to OM-2 and 3, SVG to the distal RCA with EVH from the     right leg by Dr. Laneta Simmers on November 30, 2009).    Review of Systems       As stated in the HPI and negative for all other systems.   Vital Signs:  Patient profile:   60 year old male Height:      67 inches Weight:      216 pounds BMI:     33.95 Pulse rate:   64 / minute Resp:     16 per minute BP sitting:   142 / 84  (right arm)  Vitals Entered By: Marrion Coy, CNA (March 27, 2010 1:50 PM)  Physical Exam  General:  Well developed, well nourished, in no acute distress. Head:   normocephalic and atraumatic Eyes:  PERRLA/EOM intact; conjunctiva and lids normal. Mouth:  Gums and palate normal.Poor dentition. Oral mucosa normal. Neck:  Neck supple, no JVD. No masses, thyromegaly or abnormal cervical nodes. Chest Wall:  well healed sternotomy scar Lungs:  Clear bilaterally to auscultation and percussion. Abdomen:  Bowel sounds positive; abdomen soft and non-tender without masses, organomegaly, or hernias noted. No hepatosplenomegaly. Msk:  Back normal, normal gait. Muscle strength and tone normal. Extremities:  No clubbing or cyanosis.mild hand and ankle swelling nonpitting Neurologic:  Alert and oriented x 3. Skin:  Intact without lesions or rashes. Cervical Nodes:  no significant adenopathy Inguinal Nodes:  no significant adenopathy Psych:  Normal affect.   Detailed Cardiovascular Exam  Neck    Carotids: Carotids full and equal bilaterally without bruits.      Neck Veins: Normal, no JVD.    Heart    Inspection: no deformities or lifts noted.      Palpation: normal PMI with no thrills palpable.      Auscultation: regular rate and rhythm, S1, S2 without murmurs, rubs, gallops, or clicks.    Vascular    Abdominal Aorta: no palpable masses, pulsations, or audible bruits.      Femoral Pulses: normal femoral pulses  bilaterally.      Pedal Pulses: normal pedal pulses bilaterally.      Radial Pulses: normal radial pulses bilaterally.      Peripheral Circulation: no clubbing, cyanosis, or edema noted with normal capillary refill.     EKG  Procedure date:  03/27/2010  Findings:      sinus rhythm, rate 64, axis within normal limits, intervals within normal limits, nonspecific T wave flat in  Impression & Recommendations:  Problem # 1:  CAD (ICD-414.00) The patient has had no new symptoms and should continue with risk reduction. Orders: EKG w/ Interpretation (93000)  Problem # 2:  ESSENTIAL HYPERTENSION, BENIGN (ICD-401.1) His blood pressure is slightly  high. However, he says it's in the 130s at home. Unfortunately he may be getting some gout or joint discomfort related to the hydrochlorothiazide. Therefore, I need to discontinue this drug. If he improves he will remain off the drug and if his blood pressure goes up on atypical another agent for management.  Problem # 3:  DYSLIPIDEMIA (ICD-272.4) The patient needs to be on a statin for risk reduction. I reviewed his labs in the hospital. His LDL was only 86 but his HDL was low at 32. I have advised walking. I will start pravastatin 20 mg daily.  Patient Instructions: 1)  Your physician recommends that you schedule a follow-up appointment in: 3 MONTHS WITH DR [facility] 2)  Your physician has recommended you make the following change in your medication: STOP HCTZ AND START PRAVASTATIN 20 MG A DAY Prescriptions: PRAVASTATIN SODIUM 20 MG TABS (PRAVASTATIN SODIUM) ONE AT BEDTIME  #30 x 11   Entered by:   Charolotte Capuchin, RN   Authorized by:   Rollene Rotunda, MD, [facility]   Signed by:   Charolotte Capuchin, RN on 03/27/2010   Method used:   Electronically to        [facility] (retail)       [address]       [place], [place]  [address]       Ph: [phone]       Fax: [phone]   RxID:   [id number]

## 2011-01-15 NOTE — Assessment & Plan Note (Signed)
Summary: B KNEE PAIN,MC   Vital Signs:  Patient profile:   60 year old male BP sitting:   125 / 82  Vitals Entered By: Lillia Pauls CMA (September 18, 2010 2:05 PM)  History of Present Illness: pleasant 60 year old gentleman referred by Mr. Alben Sp[facility] bilateral knee pain, left greater than right.  No history of traumatic injury. No history prior fracture, operative intervention in either affected knee.  The patient has recurrent effusions, left greater than right.  He is currently on Naprosyn. He does have some pain, primarily with standing up, moving, and in the morning. Left greater than right. No pain while sitting at baseline in the office.  No mechanical locking up of the joint. No mechanical giving way.   REVIEW OF SYSTEMS  GEN: No systemic complaints, no fevers, chills, sweats, or other acute illnesses MSK: Detailed in the HPI GI: tolerating PO intake without difficulty Neuro: No numbness, parasthesias, or tingling associated. Otherwise the pertinent positives of the ROS are noted above.    Allergies: No Known Drug Allergies  Past History:  Past medical, surgical, family and social histories (including risk factors) reviewed, and no changes noted (except as noted below).  Past Medical History: Reviewed history from 06/15/2010 and no changes required. Hypertension. Dyslipidemia   Gastroesophageal reflux.   Pulmonary embolism years ago   post op Atrial fibrillation Coronary artery disease   a.  S/p NSTEMI 12.2010 Good LVF Bilat knee pain   a.  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 CABG x5 (LIMA to LAD, SVG to diagonal branch of the LAD, SVG     sequentially to OM-2 and 3, SVG to the distal RCA with EVH from the     right leg by Dr. Laneta Simmers on November 30, 2009).    Family History: Reviewed history from 06/11/2010 and no changes  required.   Mother living at age 41.  Father deceased at age 21 of   unknown cause.  The patient has three brothers and one sister, two of   his brothers have had myocardial infarctions in their 30s.  Family History Diabetes 1st degree relative - mom NO colon or prostate cancer  Social History: Reviewed history from 06/11/2010 and no changes required.  He lives in [place] with his wife and daughter.  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  He is a 80+ pack year smoker - quit after MI. He denies ethanol abuse.   He occasionally uses marijuana.      Physical Exam  General:  alert, well-developed, and well-nourished.   Head:  normocephalic and atraumatic.   Ears:  no external deformities.   Nose:  no external deformity.   Msk:  bilateral: Lacks approximately 2 of extension.  Flexion to 120 bilaterally.  Flexion pinch is positive on the left.  Mildly tender joint lines medial and lateral on the left, and less so on the right.  Minimal movement at patella. Bilateral.  Stable varus and valgus stress. Negative Lachman. Negative anterior posterior drawer test.  McMurray's test is positive for pain on the left.  Large ballotable effusion on left. Moderate-sized ballotable effusion on the right.   Impression & Recommendations:  Problem # 1:  LOOSE BODY IN KNEE (ICD-717.6) x-rays reviewed.  Patellar groove, there is evidence of probable loose body. There is also moderate arthritic changes in the left.  Right knee  with mild osteoarthritic changes.  Probable loose body, positive McMurray's, large effusion, positive flexion pinch test. We'll obtain an MRI of the left knee. If loose bodies are present, anticipate probable operative intervention for removal in consultation with orthopedic surgery for definitive management.  Problem # 2:  INTERNAL DERANGEMENT, LEFT KNEE (ICD-717.9) cannot rule out meniscal tearing as well.  Complete Medication List: 1)  Metoprolol  Tartrate 25 Mg Tabs (Metoprolol tartrate) .... One tablet twice a day 2)  Aspirin 325 Mg Tabs (Aspirin) .Marland Kitchen.. 1 by mouth daily 3)  Lisinopril 40 Mg Tabs (Lisinopril) .... One daily 4)  Pravastatin Sodium 20 Mg Tabs (Pravastatin sodium) .... One at bedtime 5)  Hydrochlorothiazide 25 Mg Tabs (Hydrochlorothiazide) .Marland Kitchen.. 1 by mouth once daily 6)  Norvasc 5 Mg Tabs (Amlodipine besylate) .... Take 1 tablet by mouth once a day for blood pressure 7)  Ferrous Sulfate 325 (65 Fe) Mg Tabs (Ferrous sulfate) .... Take 1 tablet by mouth once daily 8)  Naprosyn 500 Mg Tabs (Naproxen) .... Take 1 tablet by mouth two times a day as needed for pain 9)  Clotrimazole 1 % Crea (Clotrimazole) .... Apply to foot two times a day for 3-4 weeks until clear  Appended Document: B KNEE PAIN,MC    Clinical Lists Changes  Orders: Added new Test order of MRI without Contrast (MRI w/o Contrast) - Signed  MRI scheduled for 09/22/10 @ 1030 am @ [address] [facility].  Auth # is [id number]. Rochele Pages RN  September 18, 2010 5:32 PM  Patient notified of appt info. Rochele Pages RN  September 19, 2010 8:49 AM

## 2011-01-15 NOTE — Progress Notes (Signed)
Summary: Heme Referral  Phone Note Outgoing Call   Summary of Call: I want to refer patient to hematology for elevated white count. I cannot explain why it is high. Please notify patient then send to [facility] to make referral. Order in system.  Initial call taken by: Tereso Newcomer PA-C,  September 28, 2010 3:00 PM  Follow-up for Phone Call        pt aware.Marland KitchenMarland KitchenMarland KitchenArmenia Shannon  October 01, 2010 10:25 AM

## 2011-01-15 NOTE — Assessment & Plan Note (Signed)
Summary: 3 [email or web address]   Visit Type:  Follow-up Primary Provider:  Tereso Newcomer, PA-C  CC:  CAD.  History of Present Illness: The patient presents for followup after bypass surgery. Since I last saw him he has done well. He denies any chest pressure, neck or arm discomfort. He has no new shortness of breath, PND or orthopnea. He has no palpitations, presyncope or syncope. He still is not smoking cigarettes. He is not exercising as much as I would like in part because he has some knee pain. He is being referred to sports medicine for treatment of this.  Current Medications (verified): 1)  Metoprolol Tartrate 25 Mg Tabs (Metoprolol Tartrate) .... One Tablet Twice A Day 2)  Prilosec 20 Mg Cpdr (Omeprazole) .... One Tab By Mouth Everday For Acid Reflux 3)  Aspirin 325 Mg  Tabs (Aspirin) .Marland Kitchen.. 1 By Mouth Daily 4)  Lisinopril 40 Mg Tabs (Lisinopril) .... One Daily 5)  Pravastatin Sodium 20 Mg Tabs (Pravastatin Sodium) .... One At Bedtime 6)  Hydrochlorothiazide 25 Mg Tabs (Hydrochlorothiazide) .Marland Kitchen.. 1 By Mouth Once Daily 7)  Norvasc 5 Mg Tabs (Amlodipine Besylate) .... Take 1 Tablet By Mouth Once A Day For Blood Pressure 8)  Ferrous Sulfate 325 (65 Fe) Mg Tabs (Ferrous Sulfate) .... Take 1 Tablet By Mouth Two Times A Day  Allergies (verified): No Known Drug Allergies  Past History:  Past Medical History: Reviewed history from 06/15/2010 and no changes required. Hypertension. Dyslipidemia   Gastroesophageal reflux.   Pulmonary embolism years ago   post op Atrial fibrillation Coronary artery disease   a.  S/p NSTEMI 12.2010 Good LVF Bilat knee pain   a.  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 CABG x5 (LIMA to LAD, SVG to diagonal branch of the LAD, SVG     sequentially to OM-2 and 3, SVG to the distal RCA with EVH from the     right leg by Dr. Laneta Simmers on November 30, 2009).      Review of Systems       As stated in the HPI and negative for all other systems.   Vital Signs:  Patient profile:   60 year old male Height:      67 inches Weight:      223 pounds BMI:     35.05 Pulse rate:   67 / minute Resp:     18 per minute BP sitting:   142 / 98  (right arm)  Vitals Entered By: Marrion Coy, CNA (July 02, 2010 1:40 PM)  Physical Exam  General:  Well developed, well nourished, in no acute distress. Head:  normocephalic and atraumatic.   Mouth:  Gums and palate normal.Poor dentition. Oral mucosa normal. Neck:  Neck supple, no JVD. No masses, thyromegaly or abnormal cervical nodes. Chest Wall:  well healed sternotomy scar Lungs:  Clear bilaterally to auscultation and percussion. Heart:  Non-displaced PMI, chest non-tender; regular rate and rhythm, S1, S2 without murmurs, rubs or gallops. Carotid upstroke normal, no bruit. Normal abdominal aortic size, no bruits. Femorals normal pulses, no bruits. Pedals normal pulses. No edema, no varicosities. Abdomen:  Bowel sounds positive; abdomen soft and non-tender without masses, organomegaly, or hernias noted. No hepatosplenomegaly. Msk:  Back normal, normal gait. Muscle strength and tone normal. Extremities:  No clubbing or cyanosis. Neurologic:  Alert and oriented x 3. Skin:  Intact without lesions or rashes. Cervical Nodes:  no significant  adenopathy Psych:  Normal affect.   EKG  Procedure date:  07/02/2010  Findings:      Sinus rhythm, rate 67, axis within normal limits, intervals within normal limits, old inferior infarct, nonspecific inferolateral T wave inversions  Impression & Recommendations:  Problem # 1:  CAD (ICD-414.00) He has no new symptoms. He needs continued risk reduction. We talked about exercise. He has no new chest pain and no further cardiovascular testing is suggested. Orders: EKG w/ Interpretation (93000)  Problem # 2:  DYSLIPIDEMIA (ICD-272.4) I gave him written instructions to  get lipids done and I would be happy to review these with a goal LDL less than 100 and HDL greater than 40.  Problem # 3:  ESSENTIAL HYPERTENSION, BENIGN (ICD-401.1) His blood pressure is not quite at target. It is better than previous. He was given a prescription for Norvasc but has not yet started this. He also needs weight loss with diet and exercise.  Patient Instructions: 1)  Your physician recommends that you schedule a follow-up appointment in: 12 months with Dr Antoine Poche 2)  Your physician recommends that you continue on your current medications as directed. Please refer to the Current Medication list given to you today.

## 2011-01-15 NOTE — Progress Notes (Signed)
Summary: c/o hands, feet are still swelling  Phone Note Call from Patient Call back at Home Phone [phone]   Caller: SpouseJunious Baldwin [phone] Reason for Call: Talk to Nurse Summary of Call: c/o hands, feet are still swelling. pt on new meds PRAVASTATIN SODIUM 20 MG TABS ONE AT BEDTIME. Initial call taken by: Lorne Skeens,  April 09, 2010 11:01 AM  Follow-up for Phone Call        talked with wife--wife states pt's ankles  and hands are swollen -they are about the same as at time of OV 03-27-10 with Dr Tristan Schroeder was stopped 03-27-10,wife states joint pain about the same,pt walking without SOB,wife states no abdominal distention/weight gain--B/P averaging around 140's/90s-will forward to Dr Antoine Poche for review     Appended Document: c/o hands, feet are still swelling Joint pain must not be related to the HCTZ. Patient should follow up with his primary MD to see if there are other potential causes for the hand and foot pain.  Appended Document: c/o hands, feet are still swelling LMVM   [phone]  Appended Document: c/o hands, feet are still swelling reviewed  with Dr Michael Boston should restart HCTZ for B/P management given no change in symptoms when pt was off of HCTZ and follow up with his PCP for other potential causes for the foot and hand pain--I discussed with wife by telephone, pt will restart HCTZ and wife is going to make arrangements for pt to get established with a  PCP at [facility] office   Clinical Lists Changes  Medications: Added new medication of HYDROCHLOROTHIAZIDE 25 MG TABS (HYDROCHLOROTHIAZIDE) one -half tablet daily

## 2011-01-15 NOTE — Progress Notes (Signed)
Summary: B/P 199/102  Phone Note Call from Patient Call back at [phone]   Caller: Patient Summary of Call: Pt calling regarding B/P issues it was 199/102 took in left arm. Initial call taken by: Judie Grieve,  January 15, 2010 11:13 AM  Follow-up for Phone Call        called pt back,  took bp at [facility] 199/102, had stop all BP meds and restarted 01/09/2010.  will re-check bp this afternoon and will call back. Also c/o pain in chest at top incision site. States Tylenol doesn't help it like the "other medicine does". Will discuss with Dr Antoine Poche and call pt back.  Pt denies any other s/s other than discomfort at the top of chest at incision site. Follow-up by: Charolotte Capuchin, RN,  January 15, 2010 11:41 AM  Additional Follow-up for Phone Call Additional follow up Details #1::        pt wife c/b   BP 180/90. Also states pt is taking metoprolol 25 mg 1/2 tablet twice a day and he had been on 25mg  twice a day.  Should he go back to 25 mg two times a day? Sander Nephew, RN    Additional Follow-up for Phone Call Additional follow up Details #2::    Increase metoprolol to 25 mg two times a day.  Also, add lisinopril 5 mg daily.  Check BMP in 2 weeks.  I wrote him a prescription for pain meds at the last appt.  If he continues to have incisional chest pain after these meds have been used he should go back to see Dr. Laneta Simmers.   Pt's wife is aware of orders.  Send new RX to [facility].  Pt will make an appointment to see Dr Laneta Simmers.  He will come to this office 01/26/10 for repeat blood work.  (bmp 401.1 v58.69) Follow-up by: Charolotte Capuchin, RN,  January 15, 2010 2:47 PM  New/Updated Medications: TOPROL XL 25 MG XR24H-TAB (METOPROLOL SUCCINATE) 1 two times a day LISINOPRIL 5 MG TABS (LISINOPRIL) one daily Prescriptions: LISINOPRIL 5 MG TABS (LISINOPRIL) one daily  #30 x 6   Entered by:   Charolotte Capuchin, RN   Authorized by:   Rollene Rotunda, MD, [facility]   Signed  by:   Charolotte Capuchin, RN on 01/15/2010   Method used:   Electronically to        [facility] (retail)       [address]       [place], [place]  [address]       Ph: [phone]       Fax: [phone]   RxID:   [id number] TOPROL XL 25 MG XR24H-TAB (METOPROLOL SUCCINATE) 1 two times a day  #60 x 6   Entered by:   Charolotte Capuchin, RN   Authorized by:   Rollene Rotunda, MD, [facility]   Signed by:   Charolotte Capuchin, RN on 01/15/2010   Method used:   Electronically to        [facility] (retail)       [address]       [place], [place]  [address]       Ph: [phone]       Fax: [phone]   RxID:   [id number]

## 2011-01-15 NOTE — Letter (Signed)
Summary: [facility]  [facility]   Imported By: Marylou Mccoy 02/01/2010 12:52:55  _____________________________________________________________________  External Attachment:    Type:   Image     Comment:   External Document

## 2011-01-15 NOTE — Progress Notes (Signed)
Summary: [facility] MEDICINE & GI REFERRAL   Phone Note Outgoing Call   Summary of Call: PT RETURN MY CALL ABOUT AN APPT THEY MISSED [facility] 07-31-10 @ 4PM . PT FORGOT ABOUT IT. I CALL [facility]ICINE TO RESCHEDULE AND THEY SAID THAT [name] NEED TO SEE FIRST TO GET ANOTHER APPT. AND HE IS AWARE OF HIS GI REFERRAL 09-13-10 @1 :45PM [facility]  Initial call taken by: Cheryll Dessert,  August 06, 2010 9:57 AM  Follow-up for Phone Call        What does he need to do to get another appt with [facility]? Follow-up by: Tereso Newcomer PA-C,  August 06, 2010 2:07 PM  Additional Follow-up for Phone Call Additional follow up Details #1::        Pt need to see you  Additional Follow-up by: Cheryll Dessert,  August 07, 2010 8:04 AM    Additional Follow-up for Phone Call Additional follow up Details #2::    Schedule an appt. Tereso Newcomer PA-C  August 07, 2010 4:45 PM   appt scheduled.Marland KitchenMarland KitchenArmenia Baldwin  August 08, 2010 8:24 AM

## 2011-01-15 NOTE — Assessment & Plan Note (Signed)
Summary: 1-2 MONTH FU FOR CPE//KT   Vital Signs:  Patient profile:   60 year old male Height:      67 inches Weight:      221 pounds BMI:     34.74 Temp:     98.0 degrees F oral Pulse rate:   61 / minute Pulse rhythm:   regular Resp:     18 per minute BP sitting:   107 / 69  (left arm)  Vitals Entered By: [name] (July 27, 2010 10:16 AM) CC: follow-up visit for CPE, partient states he is not taking Prilosec due to cost.. Is Patient Diabetic? No Pain Assessment Patient in pain? no       Does patient need assistance? Functional Status Self care Ambulation Normal   Primary Care Provider:  Tereso Newcomer, PA-C  CC:  follow-up visit for CPE and partient states he is not taking Prilosec due to cost...  History of Present Illness: Here for CPE.  Health maint: PHQ9=9.  Denies needing med.  Feels down because of his health.  He denies SI.  Offered counseling but he declined. PSA:  Discussed PSA, indications and implications of an abnormal result. Colo:  Heme cards neg x 3.  Discussed colo with patient including risks and benefits and he wants to pursue.  Problems Prior to Update: 1)  Tinea Pedis  (ICD-110.4) 2)  Preventive Health Care  (ICD-V70.0) 3)  Urinalysis, Abnormal  (ICD-791.9) 4)  Anemia, Mild  (ICD-285.9) 5)  Leukocytosis  (ICD-288.60) 6)  Leg Pain  (ICD-729.5) 7)  Knee Pain, Bilateral  (ICD-719.46) 8)  Family History Diabetes 1st Degree Relative  (ICD-V18.0) 9)  Dyslipidemia  (ICD-272.4) 10)  Encounter For Long-term Use of Other Medications  (ICD-V58.69) 11)  Essential Hypertension, Benign  (ICD-401.1) 12)  Cad  (ICD-414.00) 13)  Unspec Personal Hx Presenting Hazards Health  (ICD-V15.9) 14)  Tobacco Use, Quit  (ICD-V15.82) 15)  Atrial Fibrillation  (ICD-427.31) 16)  Gerd  (ICD-530.81) 17)  Hypertension  (ICD-401.9)  Current Medications (verified): 1)  Metoprolol Tartrate 25 Mg Tabs (Metoprolol Tartrate) .... One Tablet Twice A Day 2)  Prilosec 20  Mg Cpdr (Omeprazole) .... One Tab By Mouth Everday For Acid Reflux 3)  Aspirin 325 Mg  Tabs (Aspirin) .Marland Kitchen.. 1 By Mouth Daily 4)  Lisinopril 40 Mg Tabs (Lisinopril) .... One Daily 5)  Pravastatin Sodium 20 Mg Tabs (Pravastatin Sodium) .... One At Bedtime 6)  Hydrochlorothiazide 25 Mg Tabs (Hydrochlorothiazide) .Marland Kitchen.. 1 By Mouth Once Daily 7)  Norvasc 5 Mg Tabs (Amlodipine Besylate) .... Take 1 Tablet By Mouth Once A Day For Blood Pressure 8)  Ferrous Sulfate 325 (65 Fe) Mg Tabs (Ferrous Sulfate) .... Take 1 Tablet By Mouth Two Times A Day  Allergies (verified): No Known Drug Allergies  Past History:  Past Medical History: Reviewed history from 06/15/2010 and no changes required. Hypertension. Dyslipidemia   Gastroesophageal reflux.   Pulmonary embolism years ago   post op Atrial fibrillation Coronary artery disease   a.  S/p NSTEMI 12.2010 Good LVF Bilat knee pain   a.  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   Mother living at age 32.  Father deceased at age 76 of   unknown cause.  The patient has three brothers and one sister, two of   his brothers have had myocardial infarctions in their 43s.  Family History Diabetes 1st degree relative - mom NO colon or  prostate cancer  Social History: Reviewed history from 06/11/2010 and no changes required.  He lives in [place] with his wife and daughter.  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  He is a 80+ pack year smoker - quit after MI. He denies ethanol abuse.   He occasionally uses marijuana.      Review of Systems      See HPI General:  Complains of chills; denies fever. Eyes:  Complains of blurring. CV:  Denies chest pain or discomfort and shortness of breath with exertion; has soreness from CABG. Resp:  Denies cough. GI:  Denies bloody stools and dark tarry stools. GU:  Denies hematuria and nocturia. MS:  Complains  of joint pain; bilat knee pain. Derm:  Complains of rash; chronic on chest since [place] war. Psych:  Denies suicidal thoughts/plans. Heme:  Denies bleeding.  Physical Exam  General:  alert, well-developed, and well-nourished.   Head:  normocephalic and atraumatic.   Eyes:  pupils equal, pupils round, pupils reactive to light, and no retinal abnormalitiies.   Ears:  R ear normal and L ear normal.   Nose:  no external deformity.   Mouth:  pharynx pink and moist, poor dentition, and teeth missing.   Neck:  supple, no thyromegaly, no JVD, no carotid bruits, and no cervical lymphadenopathy.   Chest Wall:  no deformities.   Lungs:  normal breath sounds, no crackles, and no wheezes.   Heart:  normal rate, regular rhythm, and no murmur.   Abdomen:  soft, non-tender, normal bowel sounds, and no hepatomegaly.   Rectal:  normal sphincter tone, no masses, no tenderness, no fissures, no fistulae, and external hemorrhoid(s).   Genitalia:  circumcised, no hydrocele, no varicocele, no scrotal masses, no testicular masses or atrophy, no cutaneous lesions, and no urethral discharge.   Prostate:  no gland enlargement, no nodules, no asymmetry, and no induration.   Msk:  + crepitus bilat knees  Extremities:  trace left pedal edema and trace right pedal edema.   Neurologic:  alert & oriented X3 and cranial nerves II-XII intact.   Skin:  tinea pedis right plantar Psych:  normally interactive.     Impression & Recommendations:  Problem # 1:  URINALYSIS, ABNORMAL (ICD-791.9)  Orders: T-Culture, Urine ([id number]) T- * Misc. Laboratory test [phone])  Problem # 2:  ANEMIA, MILD (ICD-285.9) check labs get colo  His updated medication list for this problem includes:    Ferrous Sulfate 325 (65 Fe) Mg Tabs (Ferrous sulfate) .Marland Kitchen... Take 1 tablet by mouth two times a day  Orders: T-CBC w/Diff ([id number])  Problem # 3:  LEUKOCYTOSIS (ICD-288.60) repeat labs  Orders: T-CBC w/Diff  ([id number])  Problem # 4:  KNEE PAIN, BILATERAL (ICD-719.46) has not seen sports med clinic yet will check on referral also has pain in MCPJ of hands will check ESR again and RF and ANA  His updated medication list for this problem includes:    Aspirin 325 Mg Tabs (Aspirin) .Marland Kitchen... 1 by mouth daily    Naprosyn 500 Mg Tabs (Naproxen) .Marland Kitchen... Take 1 tablet by mouth two times a day as needed for pain  Orders: T-Sed Rate (Automated) [phone]) T-Rheumatoid Factor [phone]) T-Antinuclear Antib (ANA) ([id number])  Problem # 5:  DYSLIPIDEMIA (ICD-272.4)  His updated medication list for this problem includes:    Pravastatin Sodium 20 Mg Tabs (Pravastatin sodium) ..... One at bedtime  Orders: T-Lipid Profile ([id number])  Problem # 6:  ESSENTIAL HYPERTENSION, BENIGN (ICD-401.1) Assessment:  Improved  His updated medication list for this problem includes:    Metoprolol Tartrate 25 Mg Tabs (Metoprolol tartrate) ..... One tablet twice a day    Lisinopril 40 Mg Tabs (Lisinopril) ..... One daily    Hydrochlorothiazide 25 Mg Tabs (Hydrochlorothiazide) .Marland Kitchen... 1 by mouth once daily    Norvasc 5 Mg Tabs (Amlodipine besylate) .Marland Kitchen... Take 1 tablet by mouth once a day for blood pressure  Problem # 7:  CAD (ICD-414.00) stable f/u with card  His updated medication list for this problem includes:    Metoprolol Tartrate 25 Mg Tabs (Metoprolol tartrate) ..... One tablet twice a day    Aspirin 325 Mg Tabs (Aspirin) .Marland Kitchen... 1 by mouth daily    Lisinopril 40 Mg Tabs (Lisinopril) ..... One daily    Hydrochlorothiazide 25 Mg Tabs (Hydrochlorothiazide) .Marland Kitchen... 1 by mouth once daily    Norvasc 5 Mg Tabs (Amlodipine besylate) .Marland Kitchen... Take 1 tablet by mouth once a day for blood pressure  Problem # 8:  GERD (ICD-530.81) no longer taking prilosec d/c and take H2RA as needed   The following medications were removed from the medication list:    Prilosec 20 Mg Cpdr (Omeprazole) ..... One tab by mouth everday  for acid reflux  Problem # 9:  PREVENTIVE HEALTH CARE (ICD-V70.0)  Orders: T-PSA ([id number]) T-HIV Antibody  (Reflex) ([id number]) Gastroenterology Referral (GI)  Problem # 10:  TINEA PEDIS (ICD-110.4)  His updated medication list for this problem includes:    Clotrimazole 1 % Crea (Clotrimazole) .Marland Kitchen... Apply to foot two times a day for 3-4 weeks until clear  Complete Medication List: 1)  Metoprolol Tartrate 25 Mg Tabs (Metoprolol tartrate) .... One tablet twice a day 2)  Aspirin 325 Mg Tabs (Aspirin) .Marland Kitchen.. 1 by mouth daily 3)  Lisinopril 40 Mg Tabs (Lisinopril) .... One daily 4)  Pravastatin Sodium 20 Mg Tabs (Pravastatin sodium) .... One at bedtime 5)  Hydrochlorothiazide 25 Mg Tabs (Hydrochlorothiazide) .Marland Kitchen.. 1 by mouth once daily 6)  Norvasc 5 Mg Tabs (Amlodipine besylate) .... Take 1 tablet by mouth once a day for blood pressure 7)  Ferrous Sulfate 325 (65 Fe) Mg Tabs (Ferrous sulfate) .... Take 1 tablet by mouth two times a day 8)  Naprosyn 500 Mg Tabs (Naproxen) .... Take 1 tablet by mouth two times a day as needed for pain 9)  Clotrimazole 1 % Crea (Clotrimazole) .... Apply to foot two times a day for 3-4 weeks until clear  Other Orders: UA Dipstick w/o Micro (manual) ([id number])  Patient Instructions: 1)  Take 650 - 1000 mg of tylenol every 6 hours as needed for relief of pain or comfort of fever. Avoid taking more than 4000 mg in a 24 hour period( can cause liver damage in higher doses).  2)  You can try taking Tylenol (Acetaminophen) two times a day every day to help kee pain to a minimum. 3)  Take the Naprosyn with food only as needed ( two times a day ) for pain. 4)  You can buy Zantac or Pepcid over the counter and take as needed for heartburn. 5)  Call if you wold like to see our counselor. 6)  Put cream on foot for 3-4 weeks.  Schedule follow up if not resolved in 3-4 weeks.   7)  Please schedule a follow-up appointment in 3 months with Scott for BP.   Prescriptions: CLOTRIMAZOLE 1 % CREA (CLOTRIMAZOLE) apply to foot two times a day for 3-4 weeks until clear  #30 grams x  1   Entered and Authorized by:   Tereso Newcomer PA-C   Signed by:   Tereso Newcomer PA-C on 07/27/2010   Method used:   Print then Give to Patient   RxID:   [id number] NAPROSYN 500 MG TABS (NAPROXEN) Take 1 tablet by mouth two times a day as needed for pain  #30 x 2   Entered and Authorized by:   Tereso Newcomer PA-C   Signed by:   Tereso Newcomer PA-C on 07/27/2010   Method used:   Print then Give to Patient   RxID:   [id number]    Laboratory Results   Urine Tests    Routine Urinalysis   Color: yellow Appearance: Clear Glucose: negative   (Normal Range: Negative) Bilirubin: negative   (Normal Range: Negative) Ketone: negative   (Normal Range: Negative) Spec. Gravity: 1.015   (Normal Range: 1.003-1.035) Blood: trace-lysed   (Normal Range: Negative) pH: 6.0   (Normal Range: 5.0-8.0) Protein: negative   (Normal Range: Negative) Urobilinogen: 0.2   (Normal Range: 0-1) Nitrite: negative   (Normal Range: Negative) Leukocyte Esterace: negative   (Normal Range: Negative)      Stool - Occult Blood Hemmoccult #1: negative Date: 07/27/2010

## 2011-01-15 NOTE — Progress Notes (Signed)
Summary: Office Visit/DEPRESSION SCREENING  Office Visit/DEPRESSION SCREENING   Imported By: Arta Bruce 07/30/2010 15:39:53  _____________________________________________________________________  External Attachment:    Type:   Image     Comment:   External Document

## 2011-01-15 NOTE — Progress Notes (Signed)
Summary: bloodwork and BP RESULTS  Phone Note Other Incoming   Caller: pt  came in for blood work Details for Reason: blood work and BP Summary of Call: Pt came into the office today for bloodwork to follow up from starting Lisinopril.  He reports his BP is remaining elevated.  Will review with DOD once results are back.  per pt 01/23/2010 BP was 188/100 and today  170/90. Initial call taken by: Charolotte Capuchin, RN,  January 26, 2010 11:33 AM  Follow-up for Phone Call        Per Dr Tenny Craw pt to increase lisinopril to 10 mg daily.  He is to have his BP / BMET and EKG in 2 weeks.  left message on voice mail and will call pt back on Monday. Follow-up by: Charolotte Capuchin, RN,  January 26, 2010 5:59 PM  Additional Follow-up for Phone Call Additional follow up Details #1::        Called patient and left message on machine for pt to call back to review lisinopril and lab orders  Pam Vika Buske-Hayes, RN  PT WIFE AWARE TO INCREASE LISINOPRIL TO 10 MG DAILY AND REPEAT LABS IN TWO WEEKS.  APPT GIVEN FOR BLOODWORK AND EKG 02/13/2010, VERBAL ORDERS DR PAULA ROSS  Additional Follow-up by: Charolotte Capuchin, RN,  January 30, 2010 2:03 PM

## 2011-01-15 NOTE — Progress Notes (Signed)
Summary: call back #  Phone Note Call from Patient Call back at [phone]   Reason for Call: Talk to Nurse Summary of Call: wife states our office was to call pt back, she request call back @ number listed above Initial call taken by: Migdalia Dk,  February 13, 2010 11:26 AM  Follow-up for Phone Call        Needs HCTZ 12.5 mg added to other meds.   BP check and BMET in two weeks.  Additional Follow-up for Phone Call Additional follow up Details #1::        Left message for wife for pt to call back. Marrion Coy, CNA  February 15, 2010 9:12 AM  Spoke with pt. Pt understands to start med and come in on March 17 at 10am for BP check and BMET. Ctuck Additional Follow-up by: Marrion Coy, CNA,  February 15, 2010 9:12 AM    New/Updated Medications: HYDROCHLOROTHIAZIDE 12.5 MG CAPS (HYDROCHLOROTHIAZIDE) 1 by mouth daily Prescriptions: HYDROCHLOROTHIAZIDE 12.5 MG CAPS (HYDROCHLOROTHIAZIDE) 1 by mouth daily  #30 x 6   Entered by:   Layne Benton, RN, BSN   Authorized by:   Rollene Rotunda, MD, [facility]   Signed by:   Layne Benton, RN, BSN on 02/15/2010   Method used:   Electronically to        [facility] (retail)       [address]       [place], [place]  [address]       Ph: [phone]       Fax: [phone]   RxID:   [id number]

## 2011-01-15 NOTE — Miscellaneous (Signed)
Summary: [facility] Physician Order/Treatment Plan  [facility] Physician Order/Treatment Plan   Imported By: Roderic Ovens 01/19/2010 12:44:33  _____________________________________________________________________  External Attachment:    Type:   Image     Comment:   External Document

## 2011-01-15 NOTE — Assessment & Plan Note (Signed)
Summary: f/u 3 months for b/p/nm   Visit Type:  3 month follow up Primary Provider:  Tereso Newcomer, PA-C  CC:  F/u on BP.  History of Present Illness: Primary Cardiologist:  Dr. Rollene Rotunda   Clinton Baldwin is a 60 yo male with CAD, s/p CABG with preserved LVF whom I used to see as my patient at [facility].  He is here for follow up on BP.  When I last saw him at [facility], he had significant edema in his feet.  I stopped his norvasc and this resolved.  His BP in follow up at [facility] was good and looks good today.  He got mixed up and thought he was coming to his colonoscopy appt today.  He missed his appt last Friday.  I asked him to call the CMA at [facility] and find out where he is supposed to go.  The patient denies chest pain, dyspnea, orthopnea, PND, pedal edema or syncope.  The patient describes NYHA Class 2 symptoms.   Current Medications (verified): 1)  Metoprolol Tartrate 25 Mg Tabs (Metoprolol Tartrate) .... One Tablet Twice A Day 2)  Aspirin 325 Mg  Tabs (Aspirin) .Marland Kitchen.. 1 By Mouth Daily 3)  Lisinopril 40 Mg Tabs (Lisinopril) .... One Daily (Pt Stated Taking 25mg  Two Times A Day, 25mg  Not in System) 4)  Pravastatin Sodium 20 Mg Tabs (Pravastatin Sodium) .... One At Bedtime 5)  Hydrochlorothiazide 25 Mg Tabs (Hydrochlorothiazide) .Marland Kitchen.. 1 By Mouth Once Daily 6)  Naprosyn 500 Mg Tabs (Naproxen) .... Take 1 Tablet By Mouth Two Times A Day As Needed For Pain 7)  Clotrimazole 1 % Crea (Clotrimazole) .... Apply To Foot Two Times A Day For 3-4 Weeks Until Clear 8)  Iron 325 (65 Fe) Mg Tabs (Ferrous Sulfate) .... Once Daily  Allergies (verified): No Known Drug Allergies  Past History:  Past Surgical History: Last updated: 01/02/2010 CABG x5 (LIMA to LAD, SVG to diagonal branch of the LAD, SVG     sequentially to OM-2 and 3, SVG to the distal RCA with EVH from the     right leg by Dr. Laneta Simmers on November 30, 2009).    Family History: Last updated: 06/11/2010   Mother living at age 72.   Father deceased at age 22 of   unknown cause.  The patient has three brothers and one sister, two of   his brothers have had myocardial infarctions in their 69s.  Family History Diabetes 1st degree relative - mom NO colon or prostate cancer  Social History: Last updated: 06/11/2010  He lives in [place] with his wife and daughter.  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  He is a 80+ pack year smoker - quit after MI. He denies ethanol abuse.   He occasionally uses marijuana.      Past Medical History: Hypertension. Dyslipidemia   Gastroesophageal reflux.   Pulmonary embolism years ago   post op Atrial fibrillation Coronary artery disease   a.  S/p NSTEMI 12.2010 Good LVF Bilat knee pain   a.  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 . referred to hematology  Vital Signs:  Patient profile:   60 year old male Height:      67 inches Weight:      223.50 pounds BMI:     35.13 Pulse rate:   62 / minute BP sitting:   124 / 82  (left arm) Cuff size:   regular  Vitals  Entered By: Caralee Ates CMA (October 24, 2010 10:20 AM)  Physical Exam  General:  Well nourished, well developed, in no acute distress HEENT: normal Neck: no JVD Cardiac:  normal S1, S2; RRR; no murmur Lungs:  clear to auscultation bilaterally, no wheezing, rhonchi or rales Abd: soft, nontender, no hepatomegaly Ext: no edema Skin: warm and dry Neuro:  CNs 2-12 intact, no focal abnormalities noted    Impression & Recommendations:  Problem # 1:  HYPERTENSION (ICD-401.9)  Controlled.  Problem # 2:  EDEMA (ICD-782.3) Resolved off Norvasc. His BP is good and he can continue off.  Problem # 3:  CAD (ICD-414.00)  No anginal symptoms. Continue ASA.  Problem # 4:  DYSLIPIDEMIA (ICD-272.4) Lipids and LFTs ok at last check. Arrange f/u at next visit.  His updated medication list for this problem includes:    Pravastatin Sodium 20 Mg Tabs (Pravastatin  sodium) ..... One at bedtime  Patient Instructions: 1)  Your physician recommends that you schedule a follow-up appointment in: 6 months with Dr. Antoine Poche. The office will mail you a reminder letter 2 months prior appointment date. 2)  Your physician recommends that you continue on your current medications as directed. Please refer to the Current Medication list given to you today.

## 2011-01-15 NOTE — Progress Notes (Signed)
Summary: sports med clinic referral  Phone Note Outgoing Call   Summary of Call: Please reschedule appt with sports med clinic. Numbers are: [phone] or [phone] Initial call taken by: Tereso Newcomer PA-C,  August 29, 2010 10:31 AM  Follow-up for Phone Call        Done by [place] .Marland KitchenCheryll Dessert  August 29, 2010 10:50 AM

## 2011-01-15 NOTE — Letter (Signed)
Summary: PT INFORMATION SHEET  PT INFORMATION SHEET   Imported By: Arta Bruce 07/02/2010 10:05:22  _____________________________________________________________________  External Attachment:    Type:   Image     Comment:   External Document

## 2011-01-15 NOTE — Progress Notes (Signed)
Summary: CALL WIFE  Phone Note Call from Patient Call back at [phone]   Caller: WIFE-CONSTANCE Summary of Call: MS Streng SAYS TO CALL HER BECAUSE THERE WERE SOME MIX UP IN HIS APPTS. TODAY Initial call taken by: Leodis Rains,  October 24, 2010 12:58 PM  Follow-up for Phone Call        spoke with wife and she will talk to to [facility]Marland KitchenMarland KitchenArmenia Shannon  October 24, 2010 3:00 PM

## 2011-01-15 NOTE — Progress Notes (Signed)
Summary: Sports Med Clinic referral  Phone Note Outgoing Call   Summary of Call: Clinton Baldwin, Just followin up on this.  Did you get the message that he needs referral to Sports Med clinic at [facility]?  He needs referral for knee pain. Patient has possible pseudogout and DJD with loose bodies noted on xray.  Initial call taken by: Brynda Rim,  June 25, 2010 10:09 PM  Follow-up for Phone Call        done.faxed on 06-25-10 Follow-up by: Candi Leash,  June 26, 2010 9:25 AM

## 2011-01-15 NOTE — Progress Notes (Signed)
Summary: cough  Phone Note Call from Patient Call back at [phone]   Caller: Spouse Reason for Call: Talk to Nurse Summary of Call: pt has a cough, is there anything he can take or do Initial call taken by: Migdalia Dk,  February 23, 2010 3:41 PM  Follow-up for Phone Call        plain robutussin  Follow-up by: Charolotte Capuchin, RN,  February 23, 2010 3:51 PM

## 2011-01-15 NOTE — Letter (Signed)
Summary: Handout Printed  Printed Handout:  - Diet - Seasoning without Salt 

## 2011-01-15 NOTE — Assessment & Plan Note (Signed)
Summary: NEW MEDICAID PT/ FIRST EST CARE//GK   Vital Signs:  Patient profile:   60 year old male Height:      67 inches Weight:      220 pounds BMI:     34.58 Temp:     97.8 degrees F oral Pulse rate:   68 / minute Pulse rhythm:   regular Resp:     18 per minute BP sitting:   145 / 75  (left arm) Cuff size:   large  Vitals Entered By: [name] (June 11, 2010 3:50 PM) CC: NP.. pt has swelling in his wrist, ankles and knees.... pt says he does have pain in his legs.... pt says he had a heartattack 2010..., Hypertension Management Is Patient Diabetic? No Pain Assessment Patient in pain? no       Does patient need assistance? Functional Status Self care Ambulation Normal   Primary Care Provider:  Tereso Newcomer, PA-C  CC:  NP.. pt has swelling in his wrist, ankles and knees.... pt says he does have pain in his legs.... pt says he had a heartattack 2010..., and Hypertension Management.  History of Present Illness: New patient. Has a h/o CAD s/p CABG in 11/2009.  Had post op Afib.  Followed by Dr. Antoine Poche.  Noted some joint pain and had HCTZ held.  Joint symptoms did not resolve.  Patient notes mainly bilateral knee pain.  Seems to have noted problems after his CABG.  Knees feel stiff.  Not sure about changes with weather changes.  He notes swelling in his knees.  Also, notes swelling in ankles and pain.  Sounds like his knees changed his gait and he developed ankle pain afterwards.  He has some swelling in his wrists at times, but no pain.  He notes some pain in his calves as well.  Resting makes it better.  He denies any red or hot joints.  No pain in his great toe bilaterally.  No fevers or chills.  Has some pain over his CABG scar at times when he coughs.  No sob.  No syncope.  No PND or orthopnea.  No rashes.  Hypertension History:      Positive major cardiovascular risk factors include male age 71 years old or older, hyperlipidemia, and hypertension.        Positive  history for target organ damage include ASHD (either angina/prior MI/prior CABG).    Current Medications (verified): 1)  Metoprolol Tartrate 25 Mg Tabs (Metoprolol Tartrate) .... One Tablet Twice A Day 2)  Prilosec 20 Mg Cpdr (Omeprazole) .... One Tab By Mouth Everday For Acid Reflux 3)  Aspirin 325 Mg  Tabs (Aspirin) .Marland Kitchen.. 1 By Mouth Daily 4)  Lisinopril 40 Mg Tabs (Lisinopril) .... One Daily 5)  Pravastatin Sodium 20 Mg Tabs (Pravastatin Sodium) .... One At Bedtime 6)  Hydrochlorothiazide 25 Mg Tabs (Hydrochlorothiazide) .... One -Half Tablet Daily  Allergies (verified): No Known Drug Allergies  Past History:  Past Medical History: Hypertension. Dyslipidemia   Gastroesophageal reflux.   Pulmonary embolism years ago   post op Atrial fibrillation Coronary artery disease   a.  S/p NSTEMI 12.2010 Good LVF  Family History:   Mother living at age 8.  Father deceased at age 54 of   unknown cause.  The patient has three brothers and one sister, two of   his brothers have had myocardial infarctions in their 73s.  Family History Diabetes 1st degree relative - mom NO colon or prostate cancer  Social History:  He lives in [place] with his wife and daughter.  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  He is a 80+ pack year smoker - quit after MI. He denies ethanol abuse.   He occasionally uses marijuana.      Review of Systems  The patient denies fever, melena, hematochezia, and hematuria.    Physical Exam  General:  alert, well-developed, and well-nourished.   Head:  normocephalic and atraumatic.   Neck:  supple.   Lungs:  normal breath sounds, no crackles, and no wheezes.   Heart:  normal rate, regular rhythm, and no murmur.   Abdomen:  soft and non-tender.   Msk:  bilat knees: + crepitus bilat no eff neg McMurray neg ant drawer  right wrist:  cystic like mass noted over dorsum; no deformity of wrist  bilat ankles: swelling noted; no deformity or  crepitus  Pulses:  DP and PT diminished bilat  Extremities:  trace left pedal edema and trace right pedal edema.   Neurologic:  alert & oriented X3 and cranial nerves II-XII intact.   Psych:  normally interactive.     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 1 by mouth daily  Orders: Diagnostic X-Ray/Fluoroscopy (Diagnostic X-Ray/Flu) T-Uric Acid (Blood) (16109-60454) T-Sed Rate (Automated) (09811-91478) T-CBC w/Diff (29562-13086) T-TSH (57846-96295)  Problem # 2:  LEG PAIN (ICD-729.5)  has diminished pulses and does have some calf pain suspect symptoms all related to poss DJD however, with his risk factors will go ahead and get ABIs  Orders: LE Arterial Doppler/ABI (LE arterial doppler)  Problem # 3:  ESSENTIAL HYPERTENSION, BENIGN (ICD-401.1) change HCTZ to 1 by mouth once daily check bmet in 2 weeks  His updated medication list for this problem includes:    Metoprolol Tartrate 25 Mg Tabs (Metoprolol tartrate) ..... One tablet twice a day    Lisinopril 40 Mg Tabs (Lisinopril) ..... One daily    Hydrochlorothiazide 25 Mg Tabs (Hydrochlorothiazide) .Marland Kitchen... 1 by mouth once daily  Problem # 4:  Preventive Health Care (ICD-V70.0) schedule CPE  Problem # 5:  CAD (ICD-414.00) stable without angina  His updated medication list for this problem includes:    Metoprolol Tartrate 25 Mg Tabs (Metoprolol tartrate) ..... One tablet twice a day    Aspirin 325 Mg Tabs (Aspirin) .Marland Kitchen... 1 by mouth daily    Lisinopril 40 Mg Tabs (Lisinopril)  ..... One daily    Hydrochlorothiazide 25 Mg Tabs (Hydrochlorothiazide) .Marland Kitchen... 1 by mouth once daily  Problem # 6:  DYSLIPIDEMIA (ICD-272.4)  His updated medication list for this problem includes:    Pravastatin Sodium 20 Mg Tabs (Pravastatin sodium) ..... One at bedtime  Orders: T-Comprehensive Metabolic Panel (28413-24401)  Complete Medication List: 1)  Metoprolol Tartrate 25 Mg Tabs (Metoprolol tartrate) .... One tablet twice a day 2)  Prilosec 20 Mg Cpdr (Omeprazole) .... One tab by mouth everday for acid reflux 3)  Aspirin 325 Mg Tabs (Aspirin) .Marland Kitchen.. 1 by mouth daily 4)  Lisinopril 40 Mg Tabs (Lisinopril) .Marland KitchenMarland KitchenMarland Kitchen  One daily 5)  Pravastatin Sodium 20 Mg Tabs (Pravastatin sodium) .... One at bedtime 6)  Hydrochlorothiazide 25 Mg Tabs (Hydrochlorothiazide) .Marland Kitchen.. 1 by mouth once daily  Hypertension Assessment/Plan:      The patient's hypertensive risk group is category C: Target organ damage and/or diabetes.  Today's blood pressure is 145/75.     Patient Instructions: 1)  Increase HCTZ to one whole tablet daily for your blood pressure. 2)  BP check and BMET with the nurse in 2 weeks (401.1). 3)  Please schedule a follow-up appointment in 1-2 months with Scott for CPE.  Come fasting for labs (nothing to eat or drink after midnight except water). 4)    Prescriptions: HYDROCHLOROTHIAZIDE 25 MG TABS (HYDROCHLOROTHIAZIDE) 1 by mouth once daily  #30 x 5   Entered and Authorized by:   Tereso Newcomer PA-C   Signed by:   Tereso Newcomer PA-C on 06/11/2010   Method used:   Print then Give to Patient   RxID:   [id number]

## 2011-01-15 NOTE — Progress Notes (Signed)
Summary: Lab f/u  Phone Note Outgoing Call   Summary of Call: White count still elevated. Make sure he is not having an signs or symptoms of infection: cough, fever, rash, diarrhea, etc. Have him come in this week for a repeat CBC with diff and have the smear sent for review by the pathologist.  Anemia:  Hgb better.  He has appt in Sept for GI to get colonoscopy.  He can change Iron to once daily.  Lipids: Labs sent to Dr. Antoine Poche at [facility].  He is supposed to f/u with him for lipid mgmt.  PSA:  Test was normal. Initial call taken by: Brynda Rim,  August 06, 2010 9:08 PM  Follow-up for Phone Call        Left message on answering machine for pt to call back...Marland KitchenMarland KitchenArmenia Shannon  August 07, 2010 8:58 AM  Left message on voicemail for pt. to return call.  Dutch Quint RN  August 08, 2010 11:49 AM  Left message on voicemail for pt. to return call.  Dutch Quint RN  August 09, 2010 2:44 PM   Additional Follow-up for Phone Call Additional follow up Details #1::        three trys.... will mail letter.... [name]  August 10, 2010 9:48 AM     New/Updated Medications: FERROUS SULFATE 325 (65 FE) MG TABS (FERROUS SULFATE) Take 1 tablet by mouth once daily    Impression & Recommendations:  Problem # 1:  LEUKOCYTOSIS (ICD-288.60) WBC still high level in 16 range when he went to hosp with MI in Dec 2010 WBC went up when he developed pneumonia no symptoms of infection now get repeat with smear review if still up or smear abnormal, refer to heme  Problem # 2:  URINALYSIS, ABNORMAL (ICD-791.9) normal micro neg cx no further w/u  Problem # 3:  ANEMIA, MILD (ICD-285.9) Hgb better heme neg x 3  colo pending with GI . Marland Kitchen Marland Kitchen has appt in Sept 2011  His updated medication list for this problem includes:    Ferrous Sulfate 325 (65 Fe) Mg Tabs (Ferrous sulfate) .Marland Kitchen... Take 1 tablet by mouth once daily  Problem # 4:  KNEE PAIN, BILATERAL (ICD-719.46) patient [facility] coord. trying to get another appt set up ESR a little elevated ANA and RF neg  His updated medication list for this problem includes:    Aspirin 325 Mg Tabs (Aspirin) .Marland Kitchen... 1 by mouth daily    Naprosyn 500 Mg Tabs (Naproxen) .Marland Kitchen... Take 1 tablet by mouth two times a day as needed for pain  Problem # 5:  DYSLIPIDEMIA (ICD-272.4) HDL could be higher and LDL goal would be < 70 with CAD labs requested by Dr. Antoine Poche and sent to his office for review and mgmt  His updated medication list for this problem includes:    Pravastatin Sodium 20 Mg Tabs (Pravastatin sodium) ..... One at bedtime  Complete Medication List: 1)  Metoprolol Tartrate 25 Mg Tabs (Metoprolol tartrate) .... One tablet twice a day 2)  Aspirin 325 Mg Tabs (Aspirin) .Marland Kitchen.. 1 by mouth daily 3)  Lisinopril 40 Mg Tabs (Lisinopril) .... One daily 4)  Pravastatin Sodium 20 Mg Tabs (Pravastatin sodium) .... One at bedtime 5)  Hydrochlorothiazide 25 Mg Tabs (Hydrochlorothiazide) .Marland Kitchen.. 1 by mouth once daily 6)  Norvasc 5 Mg Tabs (Amlodipine besylate) .... Take 1 tablet by mouth once a day for blood pressure 7)  Ferrous Sulfate 325 (65 Fe) Mg Tabs (  Ferrous sulfate) .... Take 1 tablet by mouth once daily 8)  Naprosyn 500 Mg Tabs (Naproxen) .... Take 1 tablet by mouth two times a day as needed for pain 9)  Clotrimazole 1 % Crea (Clotrimazole) .... Apply to foot two times a day for 3-4 weeks until clear

## 2011-01-15 NOTE — Assessment & Plan Note (Signed)
Summary: 1 WEEK FU FOR BP CHECK///KT  Nurse Visit   Vital Signs:  Patient profile:   60 year old male Pulse rate:   60 / minute Pulse rhythm:   regular Resp:     20 per minute BP sitting:   128 / 78  (left arm) Cuff size:   large  Vitals Entered By: Dutch Quint RN (October 08, 2010 10:39 AM)  Primary Care Provider:  Tereso Newcomer, PA-C  CC:  BP recheck.  History of Present Illness: States edema is much less since stopping the Norvasc.  States asymptomatic.  Here for recheck of BP.  Took all of his meds this morning.   Review of Systems CV:  Complains of lightheadness and swelling of hands; gets lightheaded once in awhile with change of standing/sitting. Hands are still swollen, but edema to feet/ankles has greatly diminished..   Physical Exam  Lungs:  normal respiratory effort, normal breath sounds, no crackles, and no wheezes.   Heart:  normal rate and regular rhythm.     Impression & Recommendations:  Problem # 1:  ESSENTIAL HYPERTENSION, BENIGN (ICD-401.1)  Blood pressure looks good Stop Norvasc entirely, continue other meds [place] with Salt handout given, instructed re limiting sodium intake To keep f/u appt.   His updated medication list for this problem includes:    Metoprolol Tartrate 25 Mg Tabs (Metoprolol tartrate) ..... One tablet twice a day    Lisinopril 40 Mg Tabs (Lisinopril) ..... One daily    Hydrochlorothiazide 25 Mg Tabs (Hydrochlorothiazide) .Marland Kitchen... 1 by mouth once daily    Norvasc 5 Mg Tabs (Amlodipine besylate) .Marland Kitchen... Take 1 tablet by mouth once a day for blood pressure  Complete Medication List: 1)  Metoprolol Tartrate 25 Mg Tabs (Metoprolol tartrate) .... One tablet twice a day 2)  Aspirin 325 Mg Tabs (Aspirin) .Marland Kitchen.. 1 by mouth daily 3)  Lisinopril 40 Mg Tabs (Lisinopril) .... One daily 4)  Pravastatin Sodium 20 Mg Tabs (Pravastatin sodium) .... One at bedtime 5)  Hydrochlorothiazide 25 Mg Tabs (Hydrochlorothiazide) .Marland Kitchen.. 1 by mouth once  daily 6)  Norvasc 5 Mg Tabs (Amlodipine besylate) .... Take 1 tablet by mouth once a day for blood pressure 7)  Naprosyn 500 Mg Tabs (Naproxen) .... Take 1 tablet by mouth two times a day as needed for pain 8)  Clotrimazole 1 % Crea (Clotrimazole) .... Apply to foot two times a day for 3-4 weeks until clear   Patient Instructions: 1)  Reviewed Jesse Fall 2)  Blood pressure looks good. 3)  Stop taking Norvasc entirely. 4)  Continue to take other medications as ordered. 5)  Watch your salt intake -- please read the handout we've given you on seasoning without salt.  Limit intake of sodas, processed foods such as cold cuts, hot dogs, chips.  Read labels for sodium content. 6)  Keep follow-up appointment with your provider. 7)  Call if anything changes or if you have any questions.  CC: BP recheck Is Patient Diabetic? No Pain Assessment Patient in pain? no       Does patient need assistance? Functional Status Self care Ambulation Normal   Allergies: No Known Drug Allergies  Orders Added: 1)  Est. Patient Level I [04540]

## 2011-01-15 NOTE — Letter (Signed)
Summary: *HSN Results Follow up  [facility]  [address] [place], [place] [address]   Phone: [phone]  Fax: [phone]      08/01/2010   BILLIE INTRIAGO Perlow [address] [place], [place]  [address]   Dear  Mr. CHANDLOR NOECKER,                            ____S.Drinkard,FNP   ____D. Gore,FNP       ____B. McPherson,MD   ____V. Rankins,MD    ____E. Mulberry,MD    ____N. Daphine Deutscher, FNP  ____D. Reche Dixon, MD    ____K. Philipp Deputy, MD    ____Other     This letter is to inform you that your recent test(s):  _______Pap Smear    _______Lab Test     _______X-ray    _______ is within acceptable limits  _______ requires a medication change  _______ requires a follow-up lab visit  _______ requires a follow-up visit with your provider   Comments: Can you please contact our office regarding to your referrals to Gi  and sport medicine specialist .  You have an appt [facility] 09-13-10 @1 :30pm [address] Ph # [phone] and the Sport medicine was 07-31-10 @ 4pm (need to reschedule  Thank you        _________________________________________________________ If you have any questions, please contact our office                     Sincerely,  Cheryll Dessert [facility]

## 2011-01-15 NOTE — Progress Notes (Signed)
Summary: Lab results and new Rx  Phone Note Outgoing Call   Summary of Call: Patient was in today for BP check. Not sure if he got the following done.  He was supposed to get these things done: Repeat CBC RBC Folate Also, stool cards x 3.  Also, his iron levels are somewhat low. Have him start on Ferrous Sulfate 325 mg two times a day.  Please call RX to his pharmacy:  #60, 2 refills. Repeat CBC again in 3 months. Initial call taken by: Tereso Newcomer PA-C,  June 25, 2010 10:11 PM  Follow-up for Phone Call        Left message on answering machine for pt to call back...Marland KitchenMarland KitchenArmenia Shannon  June 26, 2010 8:34 AM   Additional Follow-up for Phone Call Additional follow up Details #1::        Lab appt. 09/26/10.  Advised of lab results and new Rx -- called in to [facility] on [address].   Additional Follow-up by: Dutch Quint RN,  June 29, 2010 12:04 PM    New/Updated Medications: FERROUS SULFATE 325 (65 FE) MG TABS (FERROUS SULFATE) Take 1 tablet by mouth two times a day    Impression & Recommendations:  Problem # 1:  ANEMIA, MILD (ICD-285.9)  Iron levels low Ferritin up but may be up as acute phase reactant with current knee problems will start iron x 3 mos. repeat CBC in 3 mos RBC folate still pending stool cards pending  His updated medication list for this problem includes:    Ferrous Sulfate 325 (65 Fe) Mg Tabs (Ferrous sulfate) .Marland Kitchen... Take 1 tablet by mouth two times a day  Complete Medication List: 1)  Metoprolol Tartrate 25 Mg Tabs (Metoprolol tartrate) .... One tablet twice a day 2)  Prilosec 20 Mg Cpdr (Omeprazole) .... One tab by mouth everday for acid reflux 3)  Aspirin 325 Mg Tabs (Aspirin) .Marland Kitchen.. 1 by mouth daily 4)  Lisinopril 40 Mg Tabs (Lisinopril) .... One daily 5)  Pravastatin Sodium 20 Mg Tabs (Pravastatin sodium) .... One at bedtime 6)  Hydrochlorothiazide 25 Mg Tabs (Hydrochlorothiazide) .Marland Kitchen.. 1 by mouth once daily 7)  Norvasc 5 Mg Tabs (Amlodipine besylate)  .... Take 1 tablet by mouth once a day for blood pressure 8)  Ferrous Sulfate 325 (65 Fe) Mg Tabs (Ferrous sulfate) .... Take 1 tablet by mouth two times a day

## 2011-01-15 NOTE — Assessment & Plan Note (Signed)
Summary: bp check   Nurse Visit   Vital Signs:  Patient profile:   60 year old male Pulse rate:   60 / minute Pulse rhythm:   regular Resp:     18 per minute BP sitting:   138 / 88  (left arm) Cuff size:   large  Vitals Entered By: Sherri Rad, RN, BSN (March 01, 2010 11:12 AM)  Current Medications (verified): 1)  Hydrocodone-Acetaminophen 7.5-500 Mg Tabs (Hydrocodone-Acetaminophen) .... 4-6 Hrs As Needed 2)  Metoprolol Tartrate 25 Mg Tabs (Metoprolol Tartrate) .... One Tablet Twice A Day 3)  [facility] Acid Reducer Max St 150 Mg Tabs (Ranitidine Hcl) .... Once Daily in A.m. 4)  Aspirin 81 Mg Tbec (Aspirin) .... Take One Tablet By Mouth Daily 5)  Lisinopril 40 Mg Tabs (Lisinopril) .... One Daily 6)  Hydrochlorothiazide 12.5 Mg Caps (Hydrochlorothiazide) .Marland Kitchen.. 1 By Mouth Daily  Allergies: No Known Drug Allergies  Visit Type:  Nurse Visit- BP check   History of Present Illness: The pt came today for a BP and BMET to f/u on med changes that have been made. On 02/05/10 he was put on metoprolol tart 25mg  two times a day and lisinopril 40mg  once daily. On 02/13/10 he was placed on HCTZ 12.5mg  once daily for additional BP control. The pt states today that he has been dealing with a dry  hacking cough for a few weeks now. I explained this could be related to the lisinopril. He also c/o some intermittent swelling to both hands and into his fingers. He has noticed this since his CABG in 12/10. The pt was scheduled to see Dr. Antoine Poche back 05/03/10. He would like to followup sooner, so an appointment has been made for 03/27/10. The pt is aware that his bp reading from today and report of his cough will be addressed with Dr. Antoine Poche.    Past History:  Past Medical History: Last updated: 01/02/2010 Significant for hypertension.   Gastroesophageal reflux.   Pulmonary embolism years ago   Atrial fibrillation

## 2011-01-15 NOTE — Letter (Signed)
[facility]   Imported By: Marily Memos 09/19/2010 10:16:10  _____________________________________________________________________  External Attachment:    Type:   Image     Comment:   External Document

## 2011-01-15 NOTE — Progress Notes (Signed)
Summary: QUESTIONS ABOUT HIHG B/P MEDICATION refill sent toprol  Phone Note Call from Patient Call back at [phone]   Caller: Spouse / Junious Dresser Summary of Call: PT HAVE QUESTION ABOUT HIGH B/P MEDICATIONS he was taken off his b/p meds and his pressure is elevated and she want to know if he can go back on it or something else/lg Initial call taken by: Judie Grieve,  January 08, 2010 10:09 AM  Follow-up for Phone Call        pt had stopped all of his medications except for spironalactone.  Instructed pt he was to have stopped his amiodarone and digoxin but not toprol.  Pt states understanding and requests a new RX be sent to [facility] on [address] Follow-up by: Charolotte Capuchin, RN,  January 08, 2010 1:34 PM    Prescriptions: TOPROL XL 25 MG XR24H-TAB (METOPROLOL SUCCINATE) 1/2 tab two times a day  #30 x 6   Entered by:   Charolotte Capuchin, RN   Authorized by:   Rollene Rotunda, MD, [facility]   Signed by:   Charolotte Capuchin, RN on 01/08/2010   Method used:   Electronically to        [facility] (retail)       [address]       [place], [place]  [address]       Ph: [phone]       Fax: [phone]   RxID:   [id number]

## 2011-01-15 NOTE — Letter (Signed)
Summary: *HSN Results Follow up  [facility]  [address] [place], [place] [address]   Phone: [phone]  Fax: [phone]      06/21/2010   Clinton Baldwin [address] [place], [place]  [address]   Dear  [name],                            ____S.Drinkard,FNP   ____D. Gore,FNP       ____B. McPherson,MD   ____V. Rankins,MD    ____E. Mulberry,MD    ____N. Daphine Deutscher, FNP  ____D. Reche Dixon, MD    ____K. Philipp Deputy, MD    ____Other     This letter is to inform you that your recent test(s):  _______Pap Smear    ___X____Lab Test     _______X-ray    _______ is within acceptable limits  ___X____ requires a medication change  ___X____ requires a follow-up lab visit  _______ requires a follow-up visit with your provider   Comments: We have been trying to reach you.  Please give the office a call at your earliest convenience.       _________________________________________________________ If you have any questions, please contact our office                     Sincerely,  [name] [facility]

## 2011-01-31 ENCOUNTER — Encounter (INDEPENDENT_AMBULATORY_CARE_PROVIDER_SITE_OTHER): Payer: Self-pay | Admitting: Internal Medicine

## 2011-01-31 ENCOUNTER — Encounter: Payer: Self-pay | Admitting: Internal Medicine

## 2011-02-05 ENCOUNTER — Telehealth (INDEPENDENT_AMBULATORY_CARE_PROVIDER_SITE_OTHER): Payer: Self-pay | Admitting: Internal Medicine

## 2011-02-08 ENCOUNTER — Encounter (INDEPENDENT_AMBULATORY_CARE_PROVIDER_SITE_OTHER): Payer: Self-pay | Admitting: Internal Medicine

## 2011-02-08 LAB — CONVERTED CEMR LAB
Anti Nuclear Antibody(ANA): NEGATIVE
Basophils Absolute: 0.1 10*3/uL (ref 0.0–0.1)
Basophils Relative: 1 % (ref 0–1)
Hemoglobin: 14.5 g/dL (ref 13.0–17.0)
MCHC: 32.2 g/dL (ref 30.0–36.0)
Monocytes Absolute: 1.3 10*3/uL — ABNORMAL HIGH (ref 0.1–1.0)
Neutro Abs: 10.7 10*3/uL — ABNORMAL HIGH (ref 1.7–7.7)
Neutrophils Relative %: 69 % (ref 43–77)
Platelets: 300 10*3/uL (ref 150–400)
RDW: 13.9 % (ref 11.5–15.5)
Sed Rate: 16 mm/hr (ref 0–16)

## 2011-02-12 NOTE — Progress Notes (Signed)
  Phone Note Call from Patient   Caller: Patient's Wife Summary of Call: Wife called concerned about pt. --Says that the swelling is not improving after taking the meds -- Per Dr. Delrae Alfred, pt. is to come for lab work needing to repeat CBC, Sed Rate, ANA and RF (Rheumatoid Factor) -- Also notified her that Dr. Delrae Alfred does not see any cardiac problems. -- Pt. will call back to sched. an appt. -- Hale Drone CMA  February 05, 2011 9:36 AM

## 2011-02-14 ENCOUNTER — Other Ambulatory Visit: Payer: Self-pay | Admitting: Oncology

## 2011-02-14 ENCOUNTER — Ambulatory Visit (HOSPITAL_COMMUNITY)
Admission: RE | Admit: 2011-02-14 | Discharge: 2011-02-14 | Disposition: A | Discharge status: Home / Self Care | Payer: Medicaid Other | Source: Ambulatory Visit | Attending: Oncology | Admitting: Oncology

## 2011-02-14 ENCOUNTER — Telehealth (INDEPENDENT_AMBULATORY_CARE_PROVIDER_SITE_OTHER): Payer: Self-pay | Admitting: Internal Medicine

## 2011-02-14 ENCOUNTER — Encounter (HOSPITAL_BASED_OUTPATIENT_CLINIC_OR_DEPARTMENT_OTHER): Payer: Medicaid Other | Admitting: Oncology

## 2011-02-14 DIAGNOSIS — R0602 Shortness of breath: Secondary | ICD-10-CM | POA: Insufficient documentation

## 2011-02-14 DIAGNOSIS — R609 Edema, unspecified: Secondary | ICD-10-CM

## 2011-02-14 DIAGNOSIS — D72829 Elevated white blood cell count, unspecified: Secondary | ICD-10-CM

## 2011-02-14 DIAGNOSIS — R05 Cough: Secondary | ICD-10-CM | POA: Insufficient documentation

## 2011-02-14 DIAGNOSIS — R059 Cough, unspecified: Secondary | ICD-10-CM | POA: Insufficient documentation

## 2011-02-14 DIAGNOSIS — D649 Anemia, unspecified: Secondary | ICD-10-CM | POA: Insufficient documentation

## 2011-02-14 LAB — CONVERTED CEMR LAB
ALT: 15 units/L (ref 0–53)
AST: 15 units/L (ref 0–37)
Albumin: 4.1 g/dL (ref 3.5–5.2)
Basophils Absolute: 0.1 10*3/uL (ref 0.0–0.1)
Basophils Relative: 1 % (ref 0–1)
CO2: 29 meq/L (ref 19–32)
Calcium: 9 mg/dL (ref 8.4–10.5)
Chloride: 103 meq/L (ref 96–112)
Hemoglobin: 14.8 g/dL (ref 13.0–17.0)
Lymphocytes Relative: 18 % (ref 12–46)
MCHC: 31.8 g/dL (ref 30.0–36.0)
Neutro Abs: 10 10*3/uL — ABNORMAL HIGH (ref 1.7–7.7)
Neutrophils Relative %: 69 % (ref 43–77)
Potassium: 4.8 meq/L (ref 3.5–5.3)
Pro B Natriuretic peptide (BNP): 51.6 pg/mL (ref 0.0–100.0)
RBC: 4.95 M/uL (ref 4.22–5.81)
RDW: 14.5 % (ref 11.5–15.5)
Sodium: 141 meq/L (ref 135–145)
Total Protein: 7.3 g/dL (ref 6.0–8.3)

## 2011-02-14 LAB — CBC WITH DIFFERENTIAL/PLATELET
Basophils Absolute: 0.1 10*3/uL (ref 0.0–0.1)
Eosinophils Absolute: 0.6 10*3/uL — ABNORMAL HIGH (ref 0.0–0.5)
HGB: 14 g/dL (ref 13.0–17.1)
MONO%: 9.1 % (ref 0.0–14.0)
NEUT#: 11.1 10*3/uL — ABNORMAL HIGH (ref 1.5–6.5)
RBC: 4.63 10*6/uL (ref 4.20–5.82)
RDW: 14 % (ref 11.0–14.6)
WBC: 16.1 10*3/uL — ABNORMAL HIGH (ref 4.0–10.3)
lymph#: 2.9 10*3/uL (ref 0.9–3.3)

## 2011-02-14 LAB — COMPREHENSIVE METABOLIC PANEL
AST: 14 U/L (ref 0–37)
Albumin: 3.8 g/dL (ref 3.5–5.2)
Alkaline Phosphatase: 59 U/L (ref 39–117)
BUN: 23 mg/dL (ref 6–23)
Calcium: 8.5 mg/dL (ref 8.4–10.5)
Chloride: 101 mEq/L (ref 96–112)
Glucose, Bld: 92 mg/dL (ref 70–99)
Potassium: 4.1 mEq/L (ref 3.5–5.3)
Sodium: 138 mEq/L (ref 135–145)
Total Protein: 7.2 g/dL (ref 6.0–8.3)

## 2011-02-21 NOTE — Assessment & Plan Note (Signed)
Summary: Foot Pains   Vital Signs:  Patient profile:   60 year old male Weight:      239.31 pounds Temp:     97.1 degrees F oral Pulse rate:   74 / minute Pulse rhythm:   regular Resp:     20 per minute BP sitting:   126 / 90  (left arm) Cuff size:   regular  Vitals Entered By: Hale Drone CMA (January 31, 2011 12:46 PM) CC: Hands and feet swollen x2 weeks. Would like refill on Naproxen. Bottom of right foot pain.  Is Patient Diabetic? No Pain Assessment Patient in pain? yes     Location: right  Does patient need assistance? Functional Status Self care Ambulation Normal   Primary Care Provider:  Tereso Newcomer, PA-C  CC:  Hands and feet swollen x2 weeks. Would like refill on Naproxen. Bottom of right foot pain. Marland Kitchen  History of Present Illness: 1.  Hands and feet swelling for 2 weeks as above.  Has been told he has arthritis in past, but never this bad.  Has been eating more salty foods recently.  Sits around and eats a lot--big bread intake.  Naproxen has helped with disomfort.  Unable to make a fist secondary to soft tissue swelling.  Taking HCTZ every day.  Breathing is about baseline.  Quit smoking about 1 year ago.  Has chest discomfort--but mainly when coughs or sneezes and that is high in chest.    Current Medications (verified): 1)  Metoprolol Tartrate 25 Mg Tabs (Metoprolol Tartrate) .... One Tablet Twice A Day 2)  Aspirin 325 Mg  Tabs (Aspirin) .Marland Kitchen.. 1 By Mouth Daily 3)  Lisinopril 40 Mg Tabs (Lisinopril) .... One Daily (Pt Stated Taking 25mg  Two Times A Day, 25mg  Not in System) 4)  Pravastatin Sodium 20 Mg Tabs (Pravastatin Sodium) .... One At Bedtime 5)  Hydrochlorothiazide 25 Mg Tabs (Hydrochlorothiazide) .Marland Kitchen.. 1 By Mouth Once Daily 6)  Naprosyn 500 Mg Tabs (Naproxen) .... Take 1 Tablet By Mouth Two Times A Day As Needed For Pain 7)  Clotrimazole 1 % Crea (Clotrimazole) .... Apply To Foot Two Times A Day For 3-4 Weeks Until Clear 8)  Iron 325 (65 Fe) Mg Tabs (Ferrous  Sulfate) .... Once Daily  Allergies (verified): No Known Drug Allergies  Physical Exam  General:  NAD Neck:  No obvious JVD Lungs:  Normal respiratory effort, chest expands symmetrically. Lungs are clear to auscultation, no crackles or wheezes. Heart:  Normal rate and regular rhythm. S1 and S2 normal without gallop, murmur, click, rub or other extra sounds.  Radial pulses normal and equal Extremities:  No obvious swelling of joints of hand or foot. No obvious synovial thickening.  Joints NT of hands. Appears to have some mild generalized soft tissue swelling of extrems--no pitting.  No erythema.  Not able to make tight fists secondary to the swelling--but fairly minimal   Impression & Recommendations:  Problem # 1:  EDEMA (ICD-782.3) Does not really appear to be a joint related issue, but will check for inflammation  His updated medication list for this problem includes:    Hydrochlorothiazide 25 Mg Tabs (Hydrochlorothiazide) .Marland Kitchen... 1 by mouth once daily    Furosemide 20 Mg Tabs (Furosemide) .Marland Kitchen... 1 tab by mouth daily for 3 days for swelling--eat a banana each day  Orders: T-Comprehensive Metabolic Panel ([address]) T-CBC w/Diff (09811-91478) T-BNP  (B Natriuretic Peptide) (29562-13086) Nutrition Referral (Nutrition)  Complete Medication List: 1)  Metoprolol Tartrate 25 Mg Tabs (Metoprolol tartrate) .Marland KitchenMarland KitchenMarland Kitchen  One tablet twice a day 2)  Aspirin 325 Mg Tabs (Aspirin) .Marland Kitchen.. 1 by mouth daily 3)  Lisinopril 40 Mg Tabs (Lisinopril) .... One daily (pt stated taking 25mg  two times a day, 25mg  not in system) 4)  Pravastatin Sodium 20 Mg Tabs (Pravastatin sodium) .... One at bedtime 5)  Hydrochlorothiazide 25 Mg Tabs (Hydrochlorothiazide) .Marland Kitchen.. 1 by mouth once daily 6)  Naprosyn 500 Mg Tabs (Naproxen) .... Take 1 tablet by mouth two times a day as needed for pain 7)  Lamisil At 1 % Crea (Terbinafine hcl) .... Apply two times a day to affected area of feet until rash resolved 8)  Iron 325 (65 Fe)  Mg Tabs (Ferrous sulfate) .... Once daily 9)  Furosemide 20 Mg Tabs (Furosemide) .Marland Kitchen.. 1 tab by mouth daily for 3 days for swelling--eat a banana each day  Patient Instructions: 1)  Follow up with Dr. Delrae Alfred in 2 months --swelling Prescriptions: FUROSEMIDE 20 MG TABS (FUROSEMIDE) 1 tab by mouth daily for 3 days for swelling--eat a banana each day  #30 x 0   Entered and Authorized by:   Julieanne Manson MD   Signed by:   Julieanne Manson MD on 01/31/2011   Method used:   Electronically to        [facility] (retail)       [address]       [place], [place]  [address]       Ph: [phone]       Fax: [phone]   RxID:   [id number] LAMISIL AT 1 % CREA (TERBINAFINE HCL) apply two times a day to affected area of feet until rash resolved  #30 g x 1   Entered and Authorized by:   Julieanne Manson MD   Signed by:   Julieanne Manson MD on 01/31/2011   Method used:   Electronically to        [facility] (retail)       [address]       [place], [place]  [address]       Ph: [phone]       Fax: [phone]   RxID:   [id number]    Orders Added: 1)  Est. Patient Level III [[address]] 2)  T-Comprehensive Metabolic Panel [80053-22900] 3)  T-CBC w/Diff [60109-32355] 4)  T-BNP  (B Natriuretic Peptide) [83880-55185] 5)  Nutrition Referral [Nutrition]

## 2011-02-26 NOTE — Progress Notes (Signed)
Summary: Swelling persists  Phone Note Outgoing Call   Summary of Call: Went to cancer center this morning, had CXR, having some SOB, waiting for results.  I called pt. because we received a request to refill furosemide and it was a one-time fill only.  Pt. states he did not request refill for that, that he still has 27 left from 01/31/11 when you Rx'd them for him.  However, his hands stay swollen 24/7 where he can't make a fist, and his feet still get swollen, but it comes and goes.  Wants to know if you want him to continue taking furosemide daily? Initial call taken by: Dutch Quint RN,  February 14, 2011 1:07 PM  Follow-up for Phone Call        Yes--I want him to continue to take. Let him know his labs were okay--no evidence of heart failure. No evidence of ongoing inflammation in body Follow-up by: Julieanne Manson MD,  February 14, 2011 2:03 PM  Additional Follow-up for Phone Call Additional follow up Details #1::        Pt. advised of lab results and to continue furosemide.  Wants to know if we will refill lisinopril and metoprolol -- these were originally Rx'd by Dr. Antoine Poche.  Also his furosemide, since it has no refills after this fill is done.  Dutch Quint RN  February 14, 2011 2:36 PM     Additional Follow-up for Phone Call Additional follow up Details #2::    Yes--filled. Please notify pt. Follow-up by: Julieanne Manson MD,  February 14, 2011 6:04 PM  Additional Follow-up for Phone Call Additional follow up Details #3:: Details for Additional Follow-up Action Taken: Pt. has already picked up Rxs.  Advised to call back if anything changes or if he has any questions.  Dutch Quint RN  February 18, 2011 4:35 PM   New/Updated Medications: LISINOPRIL 40 MG TABS (LISINOPRIL) 1 tab by mouth daily METOPROLOL TARTRATE 25 MG TABS (METOPROLOL TARTRATE) 1 tab by mouth two times a day Prescriptions: LISINOPRIL 40 MG TABS (LISINOPRIL) 1 tab by mouth daily  #30 x 11   Entered and Authorized  by:   Julieanne Manson MD   Signed by:   Julieanne Manson MD on 02/14/2011   Method used:   Electronically to        [facility] (retail)       [address]       [place], [place]  [address]       Ph: [phone]       Fax: [phone]   RxID:   [id number] METOPROLOL TARTRATE 25 MG TABS (METOPROLOL TARTRATE) 1 tab by mouth two times a day  #60 x 11   Entered and Authorized by:   Julieanne Manson MD   Signed by:   Julieanne Manson MD on 02/14/2011   Method used:   Electronically to        [facility] (retail)       [address]       [place], [place]  [address]       Ph: [phone]       Fax: [phone]   RxID:   [id number]

## 2011-02-28 ENCOUNTER — Telehealth (INDEPENDENT_AMBULATORY_CARE_PROVIDER_SITE_OTHER): Payer: Self-pay | Admitting: Internal Medicine

## 2011-03-14 NOTE — Progress Notes (Signed)
Summary: HAND SWOLLEN/CANT MAKE FIST  Phone Note Call from Patient Call back at Home Phone [phone]   Reason for Call: Talk to Nurse Summary of Call: Elliemae Braman pt. MR Stoffel CALLED AND SAYS THAT HI HANDS ARE SWELING AND HE CANNOT MAKE A FIST. HE ALSO SAYS THAT HE HAD CHEST X-RAY ABOUT 2 WEEKS AGO AND NO ONE HAS GOTTEN BACK WITH HIM ON THAT. Initial call taken by: Leodis Rains,  February 28, 2011 11:44 AM  Follow-up for Phone Call        Advised that I told him his xray results on 02/14/11 -- states he has some memory problems.  Is having some pains in his chest, not in the same spot, sometimes a throb, sometimes a sharp pain, most is in the center where his scar is from his bypass surgery.  Does have SOB when he exercises, or if he moves too quickly when getting up.  Once in awhile he gets dizzy if he moves a certain way, but not all the time.  Sometimes feels weak, will get up feeling good then suddenly has no energy.  Is taking furosemide as ordered with a daily banana.  Can't make a tight fist, hands are tight, swollen and feet are swollen as well.  Edema to feet comes and goes, but arms and hands maintain edema.  Wants to know if there's anything else to do for the swelling? Follow-up by: Dutch Quint RN,  February 28, 2011 4:44 PM  Additional Follow-up for Phone Call Additional follow up Details #1::        He should make an appt. to be seen again.   The Xray results appear to be taken care of, but were not ordered by this clinic as well--Dr. Clelia Croft.  Xray showed clearing of effusions from last CXR some time ago. Additional Follow-up by: Julieanne Manson MD,  March 01, 2011 10:14 AM    Additional Follow-up for Phone Call Additional follow up Details #2::    Pt. advised of provider's response -- verbalized understanding.  F/U OV scheduled 03/06/11.  Dutch Quint RN  March 04, 2011 4:09 PM

## 2011-03-18 LAB — CBC
HCT: 38.7 % — ABNORMAL LOW (ref 39.0–52.0)
Hemoglobin: 11.9 g/dL — ABNORMAL LOW (ref 13.0–17.0)
Hemoglobin: 12.3 g/dL — ABNORMAL LOW (ref 13.0–17.0)
Hemoglobin: 13.3 g/dL (ref 13.0–17.0)
MCHC: 32.8 g/dL (ref 30.0–36.0)
MCHC: 33.2 g/dL (ref 30.0–36.0)
MCHC: 33.6 g/dL (ref 30.0–36.0)
MCHC: 33.8 g/dL (ref 30.0–36.0)
MCHC: 33.9 g/dL (ref 30.0–36.0)
MCHC: 34 g/dL (ref 30.0–36.0)
MCHC: 34 g/dL (ref 30.0–36.0)
MCHC: 34.2 g/dL (ref 30.0–36.0)
MCV: 92.2 fL (ref 78.0–100.0)
MCV: 93.1 fL (ref 78.0–100.0)
MCV: 93.2 fL (ref 78.0–100.0)
MCV: 93.3 fL (ref 78.0–100.0)
Platelets: 159 10*3/uL (ref 150–400)
Platelets: 169 10*3/uL (ref 150–400)
Platelets: 220 10*3/uL (ref 150–400)
Platelets: 283 10*3/uL (ref 150–400)
RBC: 3.81 MIL/uL — ABNORMAL LOW (ref 4.22–5.81)
RBC: 3.84 MIL/uL — ABNORMAL LOW (ref 4.22–5.81)
RBC: 4.15 MIL/uL — ABNORMAL LOW (ref 4.22–5.81)
RBC: 4.25 MIL/uL (ref 4.22–5.81)
RDW: 13.4 % (ref 11.5–15.5)
RDW: 13.4 % (ref 11.5–15.5)
RDW: 13.8 % (ref 11.5–15.5)
RDW: 13.9 % (ref 11.5–15.5)
RDW: 13.9 % (ref 11.5–15.5)
RDW: 14 % (ref 11.5–15.5)
WBC: 23.8 10*3/uL — ABNORMAL HIGH (ref 4.0–10.5)
WBC: 26.4 10*3/uL — ABNORMAL HIGH (ref 4.0–10.5)

## 2011-03-18 LAB — POCT I-STAT 4, (NA,K, GLUC, HGB,HCT)
Glucose, Bld: 101 mg/dL — ABNORMAL HIGH (ref 70–99)
Glucose, Bld: 106 mg/dL — ABNORMAL HIGH (ref 70–99)
Glucose, Bld: 142 mg/dL — ABNORMAL HIGH (ref 70–99)
Glucose, Bld: 169 mg/dL — ABNORMAL HIGH (ref 70–99)
HCT: 26 % — ABNORMAL LOW (ref 39.0–52.0)
HCT: 27 % — ABNORMAL LOW (ref 39.0–52.0)
HCT: 36 % — ABNORMAL LOW (ref 39.0–52.0)
Hemoglobin: 12.2 g/dL — ABNORMAL LOW (ref 13.0–17.0)
Hemoglobin: 12.6 g/dL — ABNORMAL LOW (ref 13.0–17.0)
Hemoglobin: 8.8 g/dL — ABNORMAL LOW (ref 13.0–17.0)
Potassium: 3.9 mEq/L (ref 3.5–5.1)
Potassium: 4 mEq/L (ref 3.5–5.1)
Potassium: 4.2 mEq/L (ref 3.5–5.1)
Sodium: 135 mEq/L (ref 135–145)
Sodium: 135 mEq/L (ref 135–145)
Sodium: 139 mEq/L (ref 135–145)

## 2011-03-18 LAB — CARDIAC PANEL(CRET KIN+CKTOT+MB+TROPI)
CK, MB: 11.6 ng/mL — ABNORMAL HIGH (ref 0.3–4.0)
CK, MB: 8.2 ng/mL — ABNORMAL HIGH (ref 0.3–4.0)
Relative Index: 2.2 (ref 0.0–2.5)
Relative Index: 2.5 (ref 0.0–2.5)
Total CK: 457 U/L — ABNORMAL HIGH (ref 7–232)
Troponin I: 11.31 ng/mL (ref 0.00–0.06)
Troponin I: 13.97 ng/mL (ref 0.00–0.06)

## 2011-03-18 LAB — BASIC METABOLIC PANEL
BUN: 11 mg/dL (ref 6–23)
BUN: 13 mg/dL (ref 6–23)
BUN: 20 mg/dL (ref 6–23)
BUN: 20 mg/dL (ref 6–23)
BUN: 21 mg/dL (ref 6–23)
CO2: 22 mEq/L (ref 19–32)
CO2: 25 mEq/L (ref 19–32)
CO2: 27 mEq/L (ref 19–32)
CO2: 28 mEq/L (ref 19–32)
CO2: 29 mEq/L (ref 19–32)
CO2: 29 mEq/L (ref 19–32)
CO2: 33 mEq/L — ABNORMAL HIGH (ref 19–32)
Calcium: 7.7 mg/dL — ABNORMAL LOW (ref 8.4–10.5)
Calcium: 8.2 mg/dL — ABNORMAL LOW (ref 8.4–10.5)
Calcium: 8.4 mg/dL (ref 8.4–10.5)
Chloride: 101 mEq/L (ref 96–112)
Chloride: 102 mEq/L (ref 96–112)
Chloride: 109 mEq/L (ref 96–112)
Chloride: 98 mEq/L (ref 96–112)
Creatinine, Ser: 1.24 mg/dL (ref 0.4–1.5)
Creatinine, Ser: 1.4 mg/dL (ref 0.4–1.5)
Creatinine, Ser: 1.42 mg/dL (ref 0.4–1.5)
Creatinine, Ser: 1.84 mg/dL — ABNORMAL HIGH (ref 0.4–1.5)
Creatinine, Ser: 1.97 mg/dL — ABNORMAL HIGH (ref 0.4–1.5)
Creatinine, Ser: 1.99 mg/dL — ABNORMAL HIGH (ref 0.4–1.5)
GFR calc Af Amer: 42 mL/min — ABNORMAL LOW (ref >60)
GFR calc Af Amer: 42 mL/min — ABNORMAL LOW (ref >60)
GFR calc Af Amer: 48 mL/min — ABNORMAL LOW (ref >60)
GFR calc non Af Amer: 35 mL/min — ABNORMAL LOW (ref >60)
GFR calc non Af Amer: 51 mL/min — ABNORMAL LOW (ref >60)
GFR calc non Af Amer: 59 mL/min — ABNORMAL LOW (ref >60)
Glucose, Bld: 104 mg/dL — ABNORMAL HIGH (ref 70–99)
Glucose, Bld: 121 mg/dL — ABNORMAL HIGH (ref 70–99)
Glucose, Bld: 131 mg/dL — ABNORMAL HIGH (ref 70–99)
Glucose, Bld: 99 mg/dL (ref 70–99)
Potassium: 3.5 mEq/L (ref 3.5–5.1)
Potassium: 4.1 mEq/L (ref 3.5–5.1)
Sodium: 134 mEq/L — ABNORMAL LOW (ref 135–145)
Sodium: 138 mEq/L (ref 135–145)

## 2011-03-18 LAB — POCT I-STAT 3, ART BLOOD GAS (G3+)
Acid-base deficit: 1 mmol/L (ref 0.0–2.0)
Acid-base deficit: 2 mmol/L (ref 0.0–2.0)
Acid-base deficit: 3 mmol/L — ABNORMAL HIGH (ref 0.0–2.0)
Acid-base deficit: 3 mmol/L — ABNORMAL HIGH (ref 0.0–2.0)
Acid-base deficit: 4 mmol/L — ABNORMAL HIGH (ref 0.0–2.0)
Acid-base deficit: 4 mmol/L — ABNORMAL HIGH (ref 0.0–2.0)
Bicarbonate: 23.6 mEq/L (ref 20.0–24.0)
Bicarbonate: 24.5 mEq/L — ABNORMAL HIGH (ref 20.0–24.0)
O2 Saturation: 66 %
O2 Saturation: 80 %
O2 Saturation: 84 %
O2 Saturation: 94 %
O2 Saturation: 96 %
Patient temperature: 97.4
Patient temperature: 98.6
Patient temperature: 98.6
TCO2: 25 mmol/L (ref 0–100)
TCO2: 27 mmol/L (ref 0–100)
pCO2 arterial: 39.4 mmHg (ref 35.0–45.0)
pCO2 arterial: 39.8 mmHg (ref 35.0–45.0)
pCO2 arterial: 41.9 mmHg (ref 35.0–45.0)
pCO2 arterial: 44.4 mmHg (ref 35.0–45.0)
pH, Arterial: 7.342 — ABNORMAL LOW (ref 7.350–7.450)
pH, Arterial: 7.409 (ref 7.350–7.450)
pO2, Arterial: 101 mmHg — ABNORMAL HIGH (ref 80.0–100.0)
pO2, Arterial: 245 mmHg — ABNORMAL HIGH (ref 80.0–100.0)
pO2, Arterial: 89 mmHg (ref 80.0–100.0)

## 2011-03-18 LAB — GLUCOSE, CAPILLARY
Glucose-Capillary: 104 mg/dL — ABNORMAL HIGH (ref 70–99)
Glucose-Capillary: 111 mg/dL — ABNORMAL HIGH (ref 70–99)
Glucose-Capillary: 114 mg/dL — ABNORMAL HIGH (ref 70–99)
Glucose-Capillary: 114 mg/dL — ABNORMAL HIGH (ref 70–99)
Glucose-Capillary: 124 mg/dL — ABNORMAL HIGH (ref 70–99)
Glucose-Capillary: 130 mg/dL — ABNORMAL HIGH (ref 70–99)
Glucose-Capillary: 133 mg/dL — ABNORMAL HIGH (ref 70–99)
Glucose-Capillary: 136 mg/dL — ABNORMAL HIGH (ref 70–99)
Glucose-Capillary: 137 mg/dL — ABNORMAL HIGH (ref 70–99)
Glucose-Capillary: 85 mg/dL (ref 70–99)
Glucose-Capillary: 95 mg/dL (ref 70–99)
Glucose-Capillary: 97 mg/dL (ref 70–99)
Glucose-Capillary: 98 mg/dL (ref 70–99)

## 2011-03-18 LAB — CREATININE, SERUM
Creatinine, Ser: 1.11 mg/dL (ref 0.4–1.5)
Creatinine, Ser: 2.05 mg/dL — ABNORMAL HIGH (ref 0.4–1.5)
GFR calc Af Amer: 40 mL/min — ABNORMAL LOW (ref >60)
GFR calc non Af Amer: >60 mL/min (ref >60)

## 2011-03-18 LAB — POCT I-STAT, CHEM 8
Calcium, Ion: 1.06 mmol/L — ABNORMAL LOW (ref 1.12–1.32)
Creatinine, Ser: 2.1 mg/dL — ABNORMAL HIGH (ref 0.4–1.5)
Glucose, Bld: 126 mg/dL — ABNORMAL HIGH (ref 70–99)
HCT: 36 % — ABNORMAL LOW (ref 39.0–52.0)
Hemoglobin: 12.2 g/dL — ABNORMAL LOW (ref 13.0–17.0)
Hemoglobin: 12.6 g/dL — ABNORMAL LOW (ref 13.0–17.0)
Potassium: 4.2 mEq/L (ref 3.5–5.1)
Sodium: 137 mEq/L (ref 135–145)
TCO2: 26 mmol/L (ref 0–100)

## 2011-03-18 LAB — CULTURE, BLOOD (ROUTINE X 2): Culture: NO GROWTH

## 2011-03-18 LAB — EXPECTORATED SPUTUM ASSESSMENT W GRAM STAIN, RFLX TO RESP C

## 2011-03-18 LAB — PROTIME-INR
INR: 1.31 (ref 0.00–1.49)
Prothrombin Time: 16.2 seconds — ABNORMAL HIGH (ref 11.6–15.2)

## 2011-03-18 LAB — BRAIN NATRIURETIC PEPTIDE: Pro B Natriuretic peptide (BNP): 580 pg/mL — ABNORMAL HIGH (ref 0.0–100.0)

## 2011-03-18 LAB — CULTURE, RESPIRATORY W GRAM STAIN

## 2011-03-18 LAB — HEMOGLOBIN AND HEMATOCRIT, BLOOD: HCT: 26.9 % — ABNORMAL LOW (ref 39.0–52.0)

## 2011-03-18 LAB — MAGNESIUM
Magnesium: 2.4 mg/dL (ref 1.5–2.5)
Magnesium: 2.5 mg/dL (ref 1.5–2.5)

## 2011-03-18 LAB — PLATELET COUNT: Platelets: 151 10*3/uL (ref 150–400)

## 2011-03-19 LAB — CARDIAC PANEL(CRET KIN+CKTOT+MB+TROPI)
CK, MB: 173.4 ng/mL — ABNORMAL HIGH (ref 0.3–4.0)
CK, MB: 61.6 ng/mL — ABNORMAL HIGH (ref 0.3–4.0)
Relative Index: 19.3 — ABNORMAL HIGH (ref 0.0–2.5)
Total CK: 403 U/L — ABNORMAL HIGH (ref 7–232)
Troponin I: 2.06 ng/mL (ref 0.00–0.06)

## 2011-03-19 LAB — BLOOD GAS, ARTERIAL
Bicarbonate: 25.3 mEq/L — ABNORMAL HIGH (ref 20.0–24.0)
FIO2: 0.21 %
O2 Saturation: 91.6 %
Patient temperature: 98.6
TCO2: 26.5 mmol/L (ref 0–100)
pH, Arterial: 7.421 (ref 7.350–7.450)
pO2, Arterial: 59.3 mmHg — ABNORMAL LOW (ref 80.0–100.0)

## 2011-03-19 LAB — PROTIME-INR: INR: 0.98 (ref 0.00–1.49)

## 2011-03-19 LAB — HEPARIN LEVEL (UNFRACTIONATED)
Heparin Unfractionated: 0.24 IU/mL — ABNORMAL LOW (ref 0.30–0.70)
Heparin Unfractionated: 0.31 IU/mL (ref 0.30–0.70)

## 2011-03-19 LAB — LIPID PANEL
Cholesterol: 150 mg/dL (ref 0–200)
LDL Cholesterol: 86 mg/dL (ref 0–99)
Triglycerides: 160 mg/dL — ABNORMAL HIGH (ref <150)

## 2011-03-19 LAB — URINALYSIS, ROUTINE W REFLEX MICROSCOPIC
Bilirubin Urine: NEGATIVE
Glucose, UA: NEGATIVE mg/dL
Hgb urine dipstick: NEGATIVE
Protein, ur: NEGATIVE mg/dL
Urobilinogen, UA: 0.2 mg/dL (ref 0.0–1.0)

## 2011-03-19 LAB — CBC
HCT: 41.7 % (ref 39.0–52.0)
Hemoglobin: 16 g/dL (ref 13.0–17.0)
MCV: 92.9 fL (ref 78.0–100.0)
Platelets: 228 10*3/uL (ref 150–400)
RBC: 5.09 MIL/uL (ref 4.22–5.81)
RDW: 13.8 % (ref 11.5–15.5)
RDW: 14.1 % (ref 11.5–15.5)
WBC: 15.6 10*3/uL — ABNORMAL HIGH (ref 4.0–10.5)
WBC: 16.3 10*3/uL — ABNORMAL HIGH (ref 4.0–10.5)

## 2011-03-19 LAB — DIFFERENTIAL
Basophils Relative: 2 % — ABNORMAL HIGH (ref 0–1)
Lymphs Abs: 2.6 10*3/uL (ref 0.7–4.0)
Monocytes Relative: 7 % (ref 3–12)
Neutro Abs: 11 10*3/uL — ABNORMAL HIGH (ref 1.7–7.7)
Neutrophils Relative %: 70 % (ref 43–77)

## 2011-03-19 LAB — BASIC METABOLIC PANEL
Calcium: 8.5 mg/dL (ref 8.4–10.5)
GFR calc Af Amer: >60 mL/min (ref >60)
GFR calc non Af Amer: >60 mL/min (ref >60)
Glucose, Bld: 105 mg/dL — ABNORMAL HIGH (ref 70–99)
Sodium: 138 mEq/L (ref 135–145)

## 2011-03-19 LAB — CK TOTAL AND CKMB (NOT AT ARMC)
CK, MB: 8.6 ng/mL — ABNORMAL HIGH (ref 0.3–4.0)
Total CK: 140 U/L (ref 7–232)

## 2011-03-19 LAB — TYPE AND SCREEN
ABO/RH(D): A POS
Antibody Screen: NEGATIVE

## 2011-03-19 LAB — COMPREHENSIVE METABOLIC PANEL
AST: 51 U/L — ABNORMAL HIGH (ref 0–37)
Albumin: 3.3 g/dL — ABNORMAL LOW (ref 3.5–5.2)
BUN: 13 mg/dL (ref 6–23)
Calcium: 8.1 mg/dL — ABNORMAL LOW (ref 8.4–10.5)
Chloride: 102 mEq/L (ref 96–112)
Creatinine, Ser: 1.16 mg/dL (ref 0.4–1.5)
GFR calc Af Amer: >60 mL/min (ref >60)
Total Bilirubin: 0.7 mg/dL (ref 0.3–1.2)
Total Protein: 6.4 g/dL (ref 6.0–8.3)

## 2011-03-19 LAB — BRAIN NATRIURETIC PEPTIDE: Pro B Natriuretic peptide (BNP): 93 pg/mL (ref 0.0–100.0)

## 2011-03-19 LAB — POCT CARDIAC MARKERS
CKMB, poc: 5.6 ng/mL (ref 1.0–8.0)
Myoglobin, poc: 90.1 ng/mL (ref 12–200)

## 2011-03-19 LAB — HEMOGLOBIN A1C: Mean Plasma Glucose: 117 mg/dL

## 2011-04-15 ENCOUNTER — Other Ambulatory Visit: Payer: Self-pay | Admitting: Physician Assistant

## 2011-04-15 ENCOUNTER — Other Ambulatory Visit: Payer: Self-pay | Admitting: Cardiology

## 2011-04-30 NOTE — Assessment & Plan Note (Signed)
OFFICE VISIT   Baldwin, Clinton  DOB:  1951-10-27                                        January 02, 2010  CHART #:  [id number]   The patient returned to my office today for followup status post  coronary artery bypass graft surgery x5 on November 30, 2009.  He said  that he has been feeling well overall and has been walking around his  house without any chest pain or shortness of breath.  He has continued  to abstain from smoking and said he has no desire to return to that.  Unfortunately, his wife continues to smoke.   On physical examination today, his blood pressure is 196/96, his pulse  is 70 and regular, and respiratory rate is 18 unlabored.  Oxygen  saturation on room air is 94-95%.  He looks well.  Cardiac exam shows  regular rate and rhythm with normal heart sounds.  His lung exam is  clear.  The chest incision is healing well and the sternum is stable.  His right leg incision is healing well and there is no peripheral edema.   Followup chest x-ray today shows clear lung fields and no pleural  effusions.   His medications are:  1. Lopressor 25 mg b.i.d.  2. Digoxin 0.125 mg daily.  3. Amiodarone 200 mg b.i.d.  4. Zantac 150 mg p.r.n.  5. Aspirin 81 mg daily.   IMPRESSION:  Overall, the patient is recovering well following his  surgery.  He had postoperative atrial fibrillation, but appears to be  maintaining sinus rhythm.  He has a followup appointment later today  with Dr. Antoine Poche, who can decide about discontinuing his amiodarone.  I  told him he can return to driving a car, but should refrain from lifting  anything heavier than 10  pounds for a total of 3 months from date of surgery.  I told him he did  not need to return to see me unless he has problem with his incisions.   Evelene Croon, M.D.  Electronically Signed   BB/MEDQ  D:  01/02/2010  T:  01/02/2010  Job:  [id number]

## 2011-05-22 ENCOUNTER — Other Ambulatory Visit (HOSPITAL_COMMUNITY): Payer: Self-pay | Admitting: Internal Medicine

## 2011-05-22 DIAGNOSIS — N289 Disorder of kidney and ureter, unspecified: Secondary | ICD-10-CM

## 2011-05-29 ENCOUNTER — Ambulatory Visit (HOSPITAL_COMMUNITY)
Admission: RE | Admit: 2011-05-29 | Discharge: 2011-05-29 | Disposition: A | Discharge status: Home / Self Care | Payer: Medicaid Other | Source: Ambulatory Visit | Attending: Internal Medicine | Admitting: Internal Medicine

## 2011-05-29 DIAGNOSIS — K7689 Other specified diseases of liver: Secondary | ICD-10-CM | POA: Insufficient documentation

## 2011-05-29 DIAGNOSIS — N289 Disorder of kidney and ureter, unspecified: Secondary | ICD-10-CM | POA: Insufficient documentation

## 2011-05-29 DIAGNOSIS — I10 Essential (primary) hypertension: Secondary | ICD-10-CM | POA: Insufficient documentation

## 2011-07-12 ENCOUNTER — Encounter: Payer: Self-pay | Admitting: Cardiology

## 2011-07-13 IMAGING — CR DG CHEST 2V
2 series · 2 of 2 positions shown · non-contrast
Comparison: 11/28/2009

CLINICAL DATA: Chest pain.  Coronary artery disease.  Preop for
bypass surgery.

CHEST - 2 VIEW

[w chest pa]
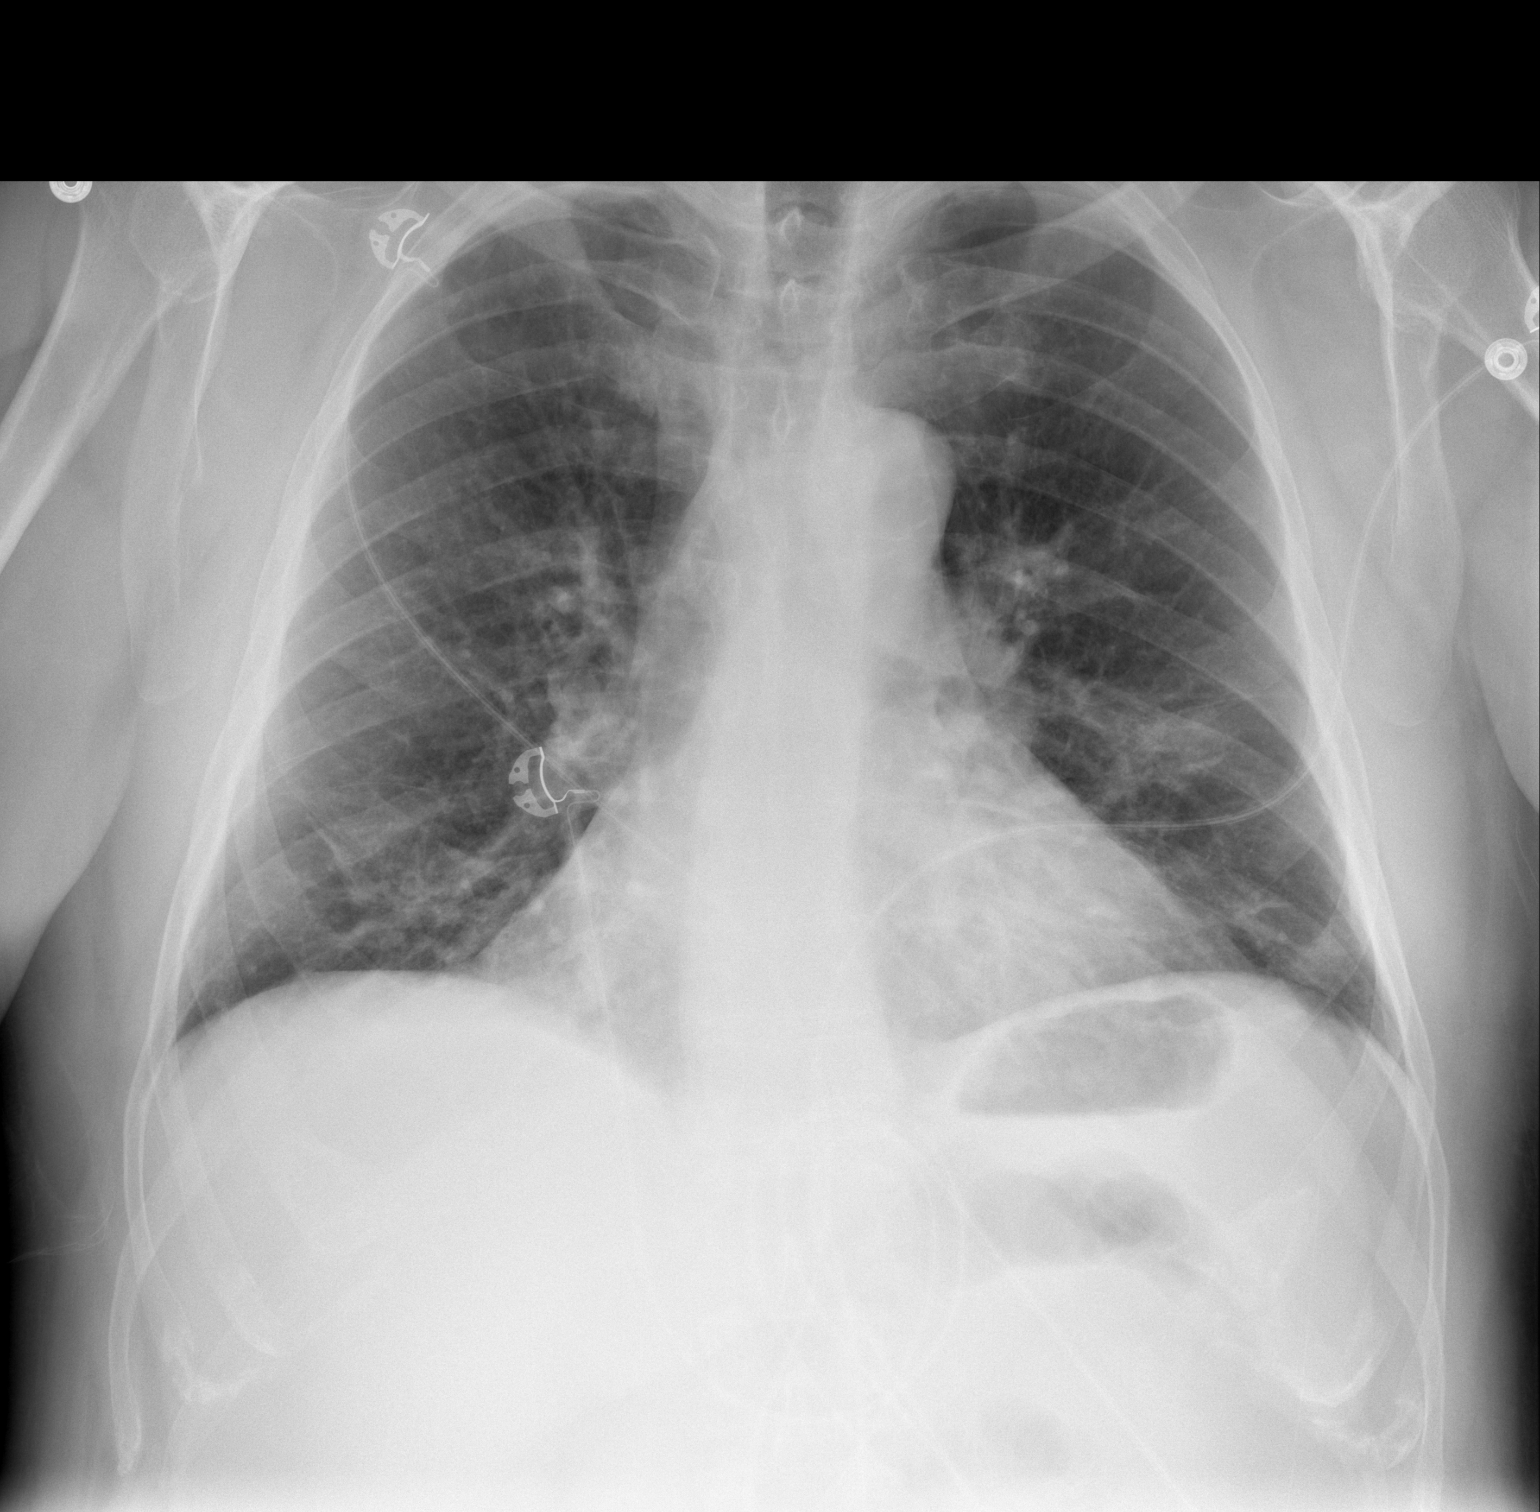

[w chest lat]
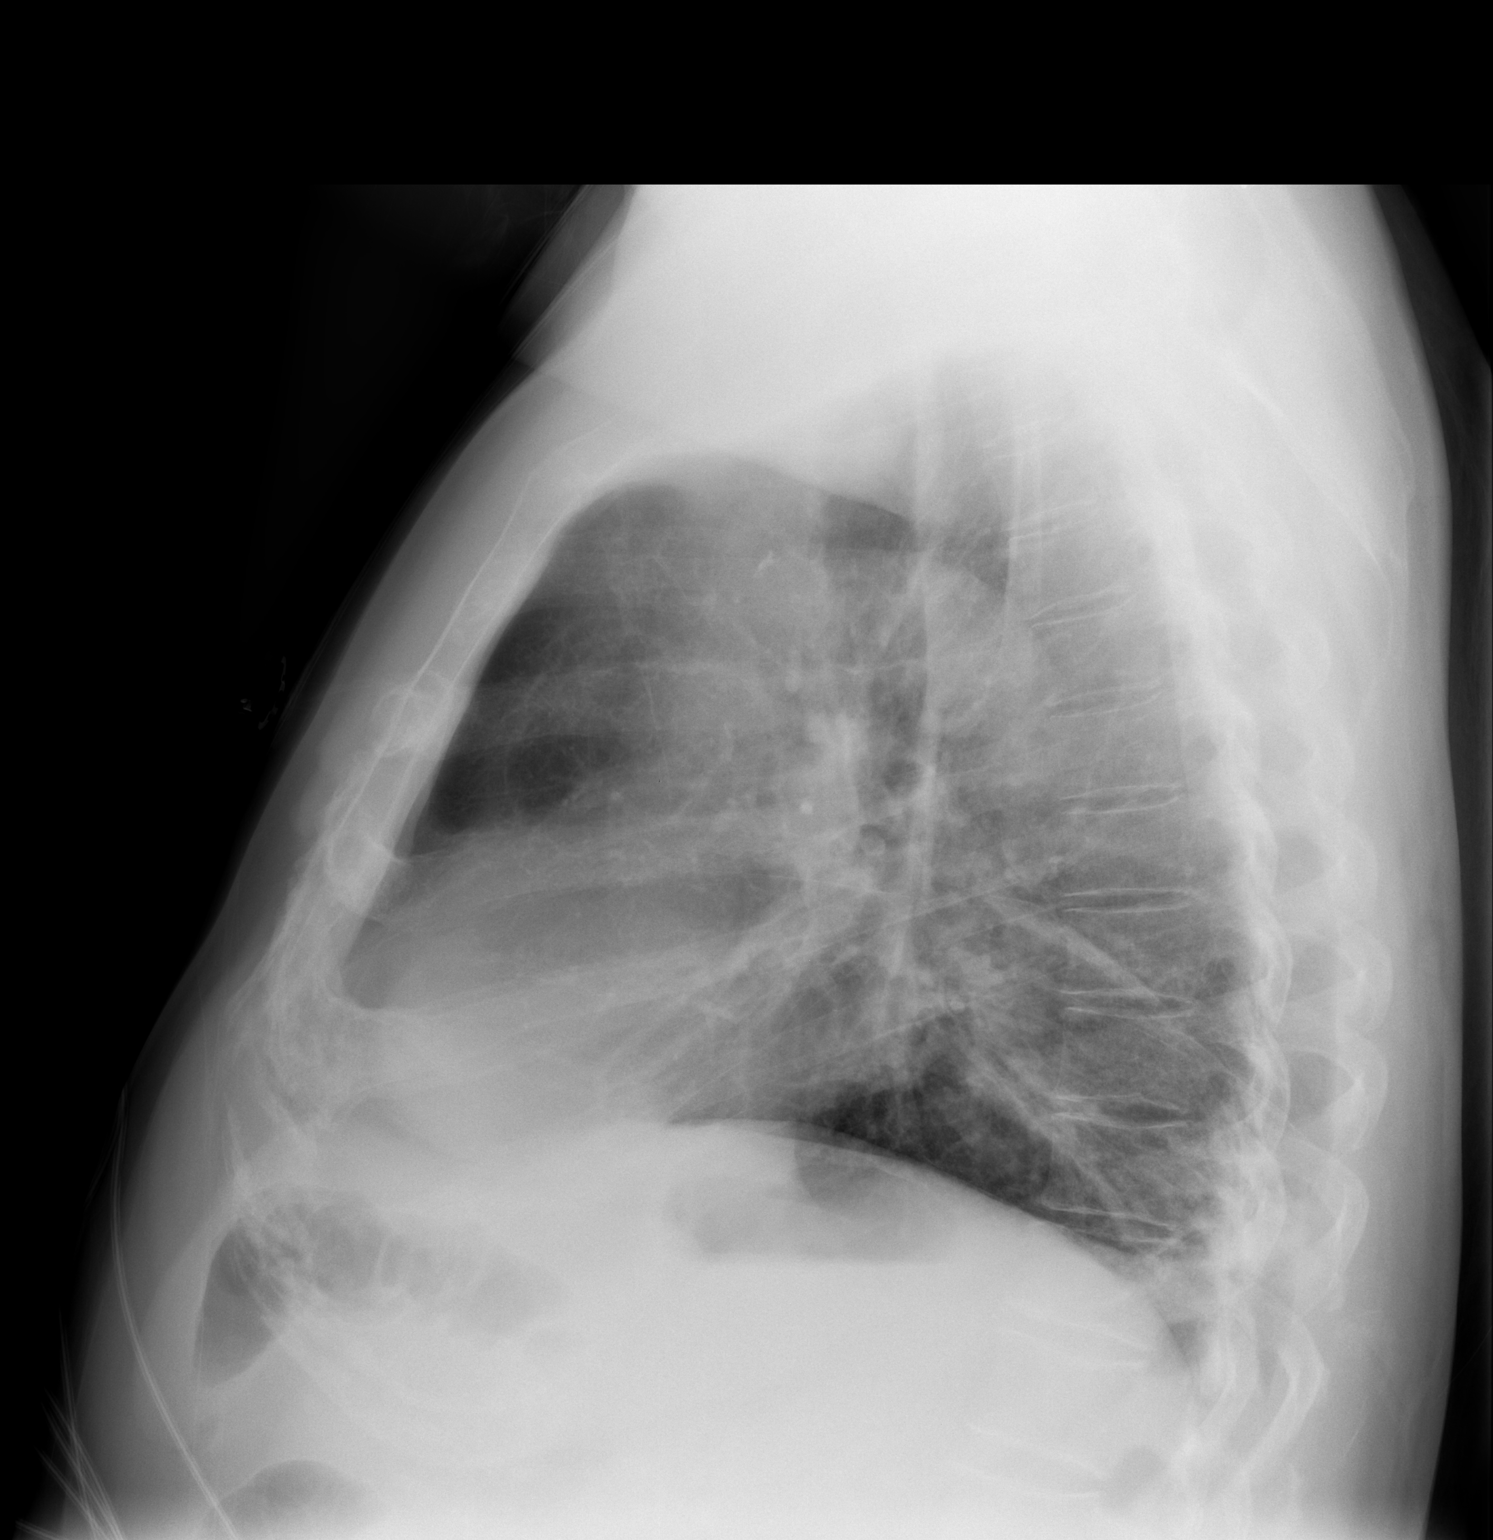

[2 of 2 positions shown; findings below may reference images not displayed]

FINDINGS: The heart is mildly enlarged but stable.  Low lung
volumes with vascular crowding and streaky areas of atelectasis.
No edema or effusions.  The bony thorax is intact.
IMPRESSION: Low lung volumes with vascular crowding and streaky areas of
bilateral atelectasis.

## 2011-07-14 IMAGING — CR DG CHEST 1V PORT
1 series · 1 of 1 positions shown · non-contrast
Comparison: Chest x-ray of 11/29/2009

CLINICAL DATA: Post CABG

PORTABLE CHEST - 1 VIEW

[AP]
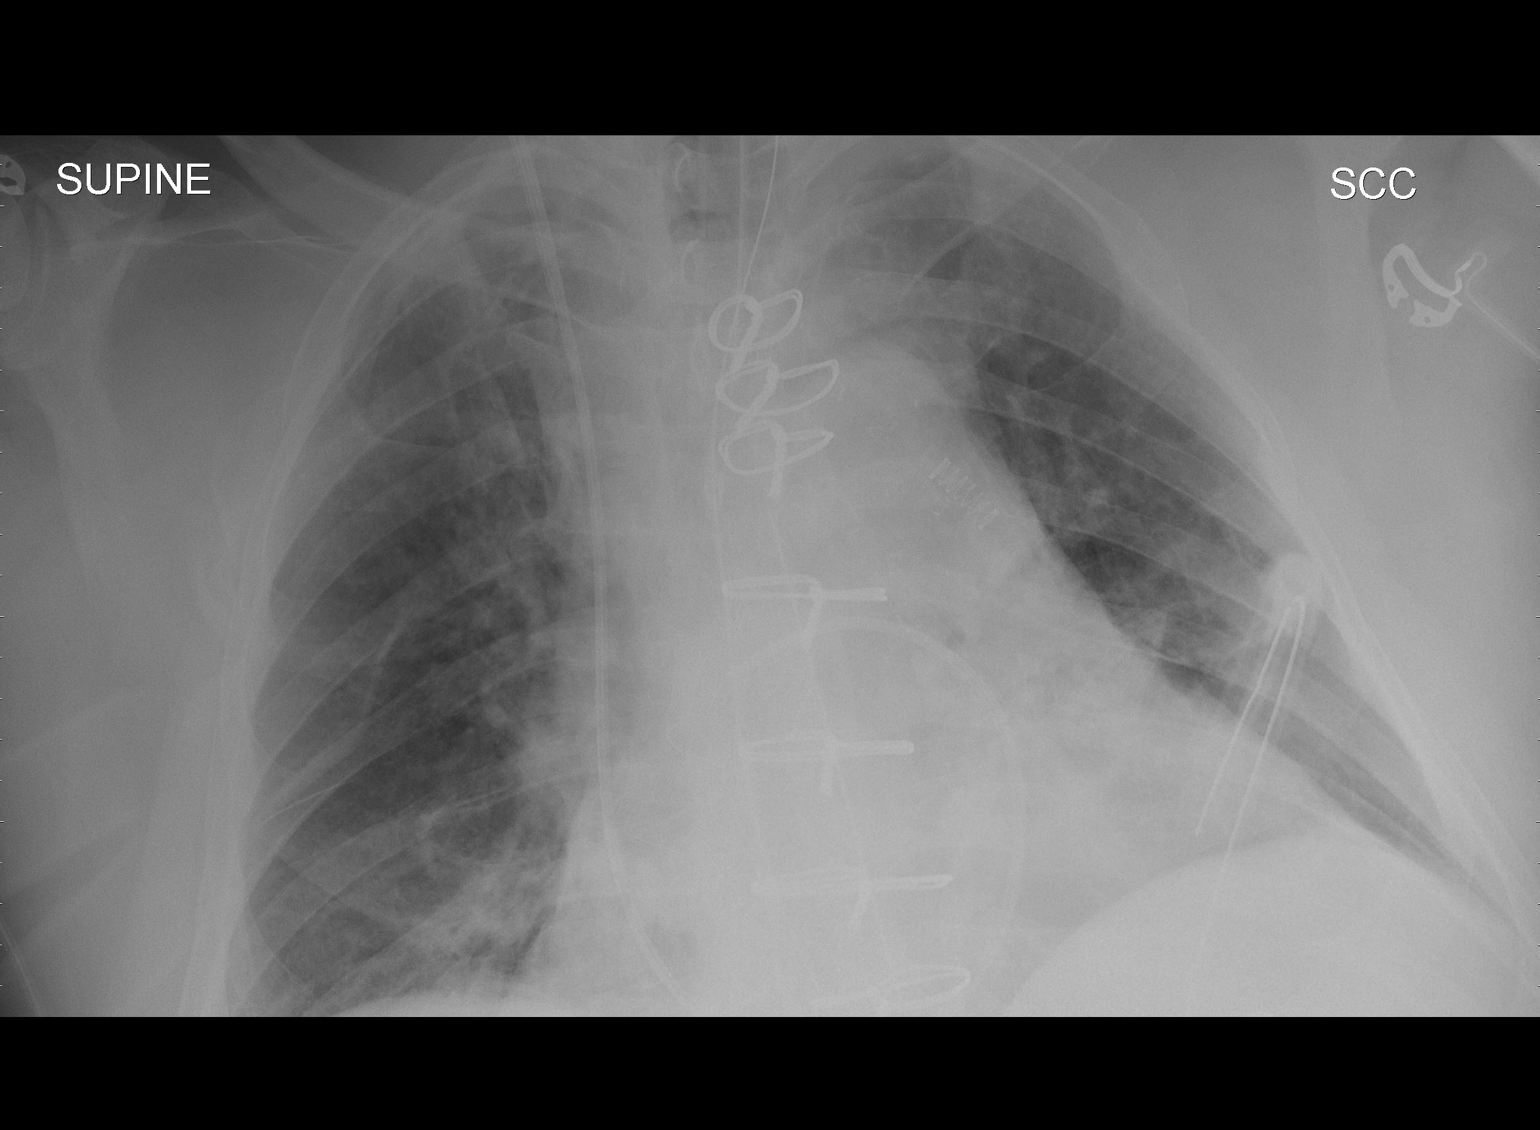

[1 of 1 positions shown; findings below may reference images not displayed]

FINDINGS: The tip of the endotracheal tube is approximately 3.8 cm
above the carina.  Swan-Ganz catheter is present with the tip in
the right main pulmonary artery.  Left chest tube is present and no
pneumothorax is seen.  Linear atelectasis is noted bilaterally.
Cardiomegaly is stable.  NG tube is present.
IMPRESSION: 1.  Endotracheal tube tip 3.8 cm above carina.
2.  Swan-Ganz catheter and left chest tube present.  Mild linear
atelectasis bilaterally.

## 2011-07-15 IMAGING — CR DG CHEST 1V PORT
1 series · 1 of 1 positions shown · non-contrast
Comparison: 12/01/2009

CLINICAL DATA: Respiratory distress

PORTABLE CHEST - 1 VIEW

[AP]
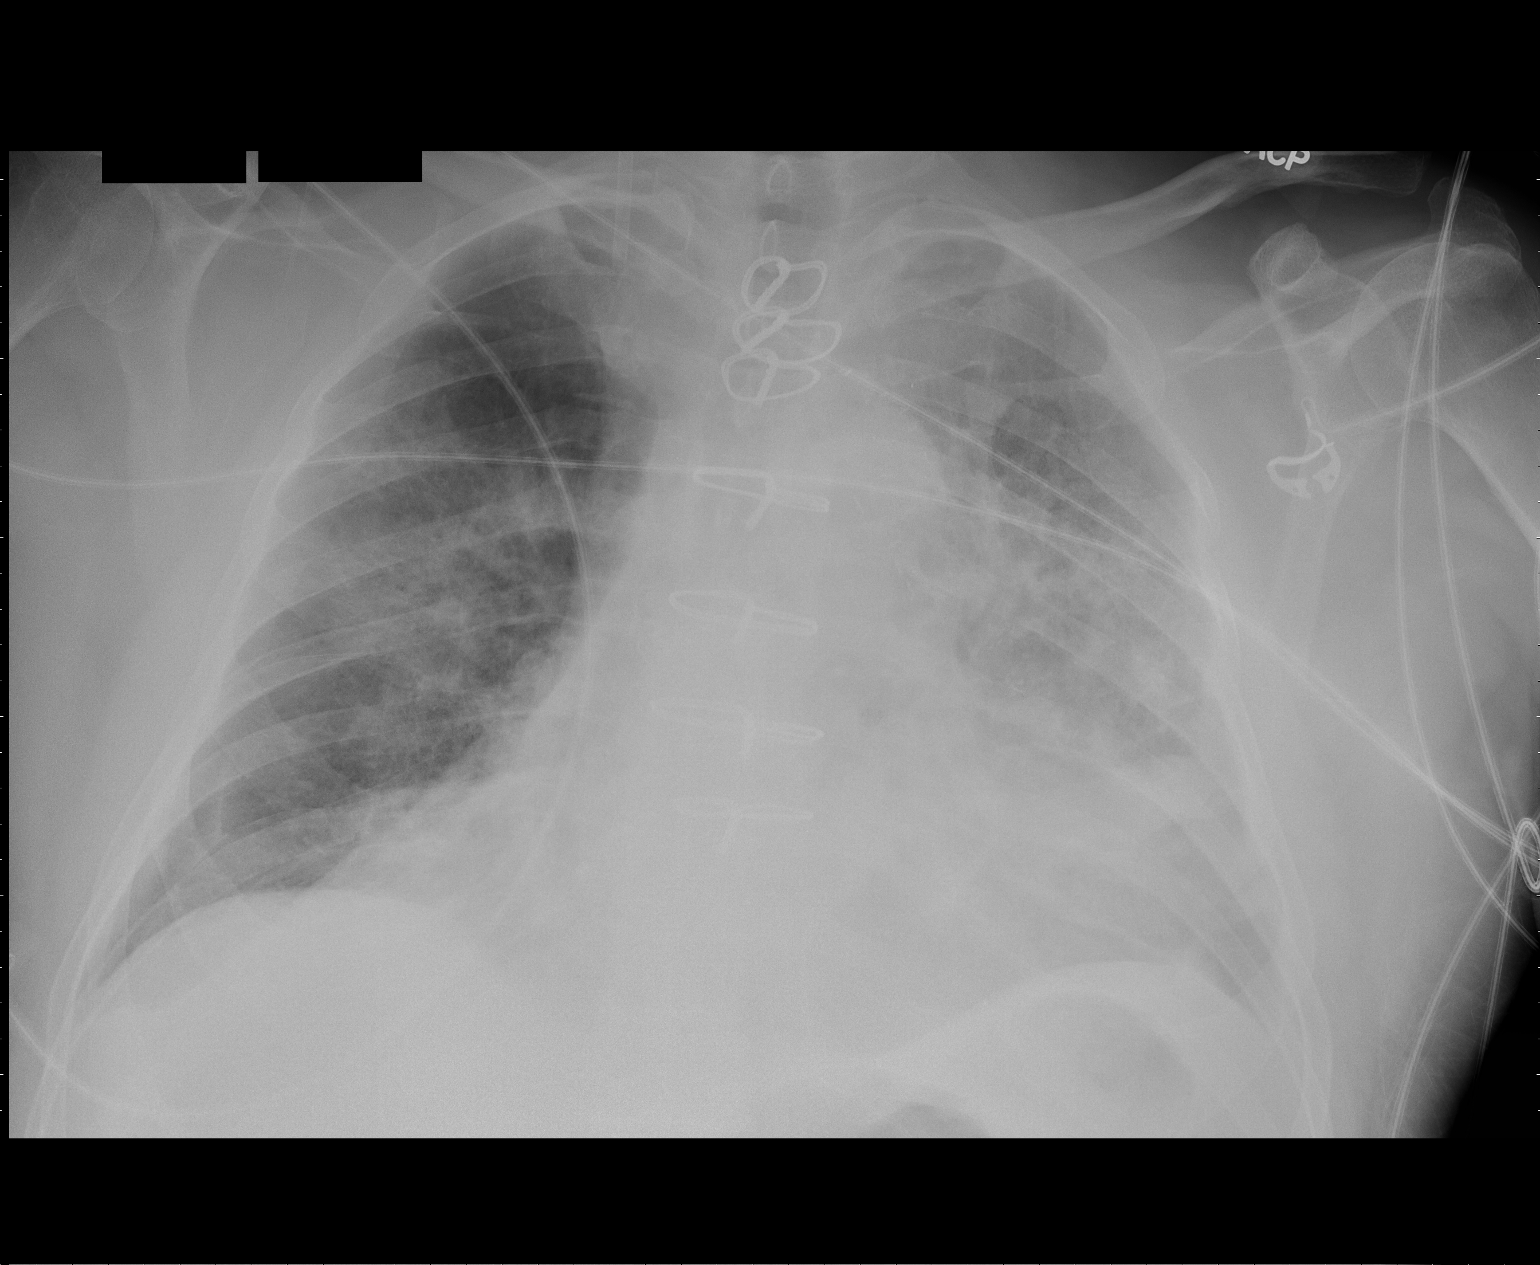

[1 of 1 positions shown; findings below may reference images not displayed]

FINDINGS: Interval removal of Swan-Ganz catheter and left chest
tube.  No pneumothorax.  Bilateral airspace disease has worsened,
left greater than right.  I suspect this represents edema.  There
is cardiomegaly.  No effusions.
IMPRESSION: Worsening bilateral airspace disease, left greater than right, most
likely asymmetric edema.

Interval removal of left chest tube without pneumothorax.

## 2011-07-16 IMAGING — CR DG CHEST 1V PORT
1 series · 1 of 1 positions shown · non-contrast
Comparison: 1 day prior

CLINICAL DATA: CABG.

PORTABLE CHEST - 1 VIEW

[AP]
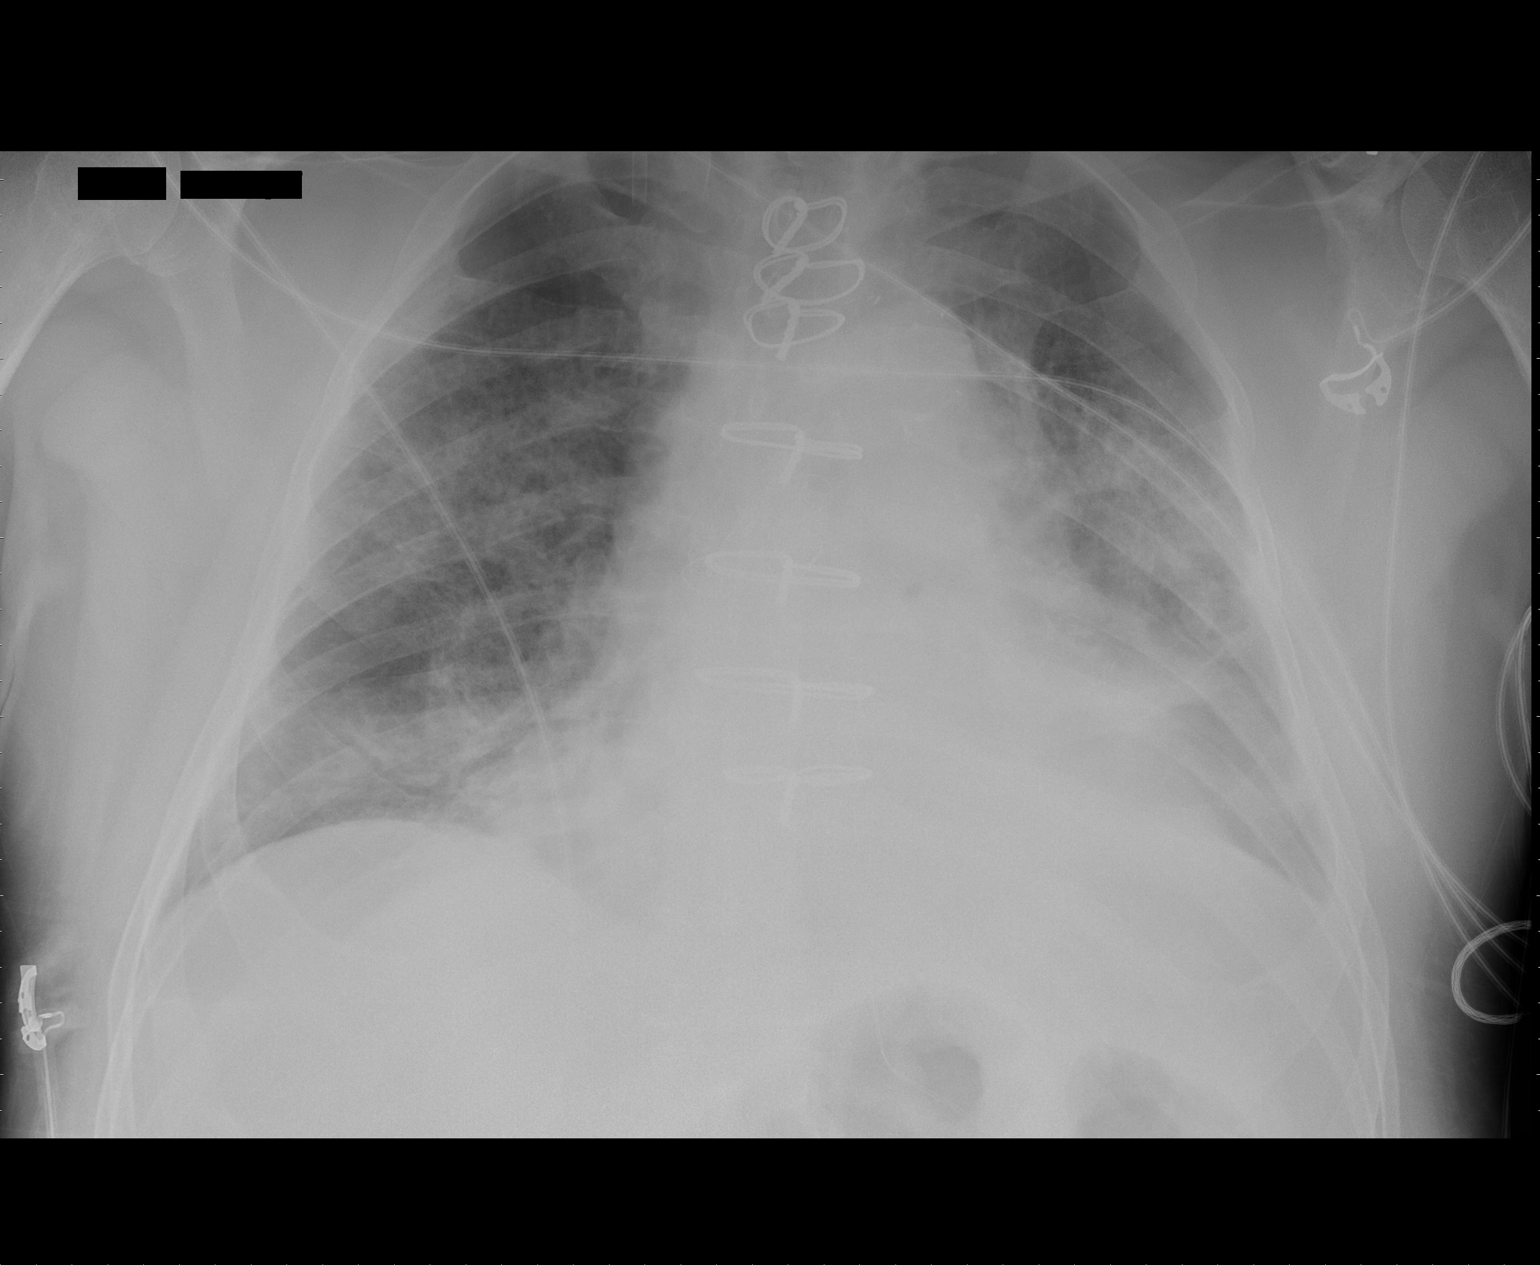

[1 of 1 positions shown; findings below may reference images not displayed]

FINDINGS: Right IJ Cordis sheath remains in place. Prior median
sternotomy. Midline trachea.  Cardiomegaly accentuated by AP
portable technique.  Possible small layering left pleural effusion.
No pneumothorax.  Coarse interstitial opacities throughout the left
greater than right lungs.  Slightly improved.  More focal airspace
disease in the left greater right lower lobes is similar.
IMPRESSION: Minimal improvement in combination of interstitial and air space
disease.  Favor asymmetric pulmonary edema.

## 2011-08-15 ENCOUNTER — Ambulatory Visit: Payer: Medicaid Other | Admitting: Cardiology

## 2011-11-16 ENCOUNTER — Other Ambulatory Visit: Payer: Self-pay | Admitting: Physician Assistant

## 2011-11-18 NOTE — Telephone Encounter (Signed)
Pt needs ov. 

## 2012-01-16 ENCOUNTER — Other Ambulatory Visit: Payer: Self-pay | Admitting: Cardiology

## 2012-03-02 ENCOUNTER — Ambulatory Visit (INDEPENDENT_AMBULATORY_CARE_PROVIDER_SITE_OTHER): Payer: Self-pay | Admitting: Cardiology

## 2012-03-02 ENCOUNTER — Encounter: Payer: Self-pay | Admitting: Cardiology

## 2012-03-02 VITALS — BP 116/74 | HR 59 | Ht 67.0 in | Wt 246.0 lb

## 2012-03-02 DIAGNOSIS — R0602 Shortness of breath: Secondary | ICD-10-CM

## 2012-03-02 DIAGNOSIS — I4891 Unspecified atrial fibrillation: Secondary | ICD-10-CM

## 2012-03-02 DIAGNOSIS — E7800 Pure hypercholesterolemia, unspecified: Secondary | ICD-10-CM

## 2012-03-02 DIAGNOSIS — R0989 Other specified symptoms and signs involving the circulatory and respiratory systems: Secondary | ICD-10-CM

## 2012-03-02 DIAGNOSIS — R0600 Dyspnea, unspecified: Secondary | ICD-10-CM

## 2012-03-02 DIAGNOSIS — R0609 Other forms of dyspnea: Secondary | ICD-10-CM

## 2012-03-02 NOTE — Assessment & Plan Note (Signed)
Given his symptoms stress testing is indicated. Because of his dyspnea he would not be a walk on a treadmill. Therefore, he will have a [employer].  I suspect this will look at normal with his severe native vessel coronary disease. He understands we're looking for high-risk findings.

## 2012-03-02 NOTE — Assessment & Plan Note (Signed)
I will check a lipid profile when he returns. 

## 2012-03-02 NOTE — Assessment & Plan Note (Signed)
The blood pressure is at target. No change in medications is indicated. We will continue with therapeutic lifestyle changes (TLC).  

## 2012-03-02 NOTE — Patient Instructions (Signed)
The current medical regimen is effective;  continue present plan and medications.  Your physician has requested that you have a lexiscan myoview. For further information please visit [email or web address] Please follow instruction sheet, as given.  Please return fasting the same day as your stress test to have lipid profile and BNP

## 2012-03-02 NOTE — Progress Notes (Signed)
HPI The patient presents for follow up of CAD.  He has not been seen in this clinic for a while. He presents because he did have some left upper chest discomfort about 3 weeks ago. This happened 2 times in one night. It was not like his previous angina. He was in his left upper shoulder and came and went spontaneously. There were no associated symptoms. He has had shortness of breath with activities. However, he is very sedentary. He does have some soreness associated with his sternotomy but this is unchanged from previous. He's not describing palpitations, presyncope or syncope. He's had no PND or orthopnea. He does have some mild swelling in his feet and hands.  No Known Allergies  Current Outpatient Prescriptions  Medication Sig Dispense Refill  . aspirin 325 MG tablet Take 325 mg by mouth daily.        . Ferrous Sulfate (IRON) 325 (65 FE) MG TABS Take 1 tablet by mouth daily.        . hydrochlorothiazide (HYDRODIURIL) 25 MG tablet TAKE ONE TABLET BY MOUTH EVERY DAY  30 tablet  1  . lisinopril (PRINIVIL,ZESTRIL) 40 MG tablet Take 40 mg by mouth daily.        . meloxicam (MOBIC) 15 MG tablet Take 15 mg by mouth daily.      . metoprolol tartrate (LOPRESSOR) 25 MG tablet Take 25 mg by mouth 2 (two) times daily.        . pravastatin (PRAVACHOL) 20 MG tablet TAKE ONE TABLET BY MOUTH EVERY DAY AT BEDTIME  30 tablet  1  . terbinafine (LAMISIL) 1 % cream Apply 1 application topically 2 (two) times daily.          Past Medical History  Diagnosis Date  . Hypertension   . Dyslipidemia   . GERD (gastroesophageal reflux disease)   . History of pulmonary embolism     years ago  . Atrial fibrillation     post op  . Coronary artery disease     s/p STEMI 11/2009  . 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 Leukocytosis     referred to hematology    Past Surgical History  Procedure Date  . Coronary artery bypass graft 11/30/2009    x5. LIMA to LAD, SVG to  diagonal branch of LAD, SVG sequentially to OM2 and 3, SVG to distal RCA with EVH from the right leg     ROS:  As stated in the HPI and negative for all other systems.  PHYSICAL EXAM BP 116/74  Pulse 59  Ht 5\' 7"  (1.702 m)  Wt 246 lb (111.585 kg)  BMI 38.53 kg/m2 GENERAL:  Well appearing HEENT:  Pupils equal round and reactive, fundi not visualized, oral mucosa unremarkable, poor dentition NECK:  No jugular venous distention, waveform within normal limits, carotid upstroke brisk and symmetric, no bruits, no thyromegaly LYMPHATICS:  No cervical, inguinal adenopathy LUNGS:  Clear to auscultation bilaterally BACK:  No CVA tenderness CHEST:  Well healed sternotomy scar. HEART:  PMI not displaced or sustained,S1 and S2 within normal limits, no S3, no S4, no clicks, no rubs, no murmurs ABD:  Flat, positive bowel sounds normal in frequency in pitch, no bruits, no rebound, no guarding, no midline pulsatile mass, no hepatomegaly, no splenomegaly, obese EXT:  2 plus pulses throughout, no edema, no cyanosis no clubbing, large lipoma right shoulder SKIN:  No rashes no nodules NEURO:  Cranial nerves II through  XII grossly intact, motor grossly intact throughout PSYCH:  Cognitively intact, oriented to person place and time  EKG:  Sinus bradycardia, rate 59, axis within normal limits, intervals within normal limits, possible old inferior infarct, early transition in lead V2, inferolateral T wave inversions unchanged from previous. 03/02/2012   ASSESSMENT AND PLAN

## 2012-03-02 NOTE — Assessment & Plan Note (Signed)
I suspect that this is related to deconditioning. However, I will check a BNP level.

## 2012-03-10 ENCOUNTER — Other Ambulatory Visit (INDEPENDENT_AMBULATORY_CARE_PROVIDER_SITE_OTHER): Payer: Self-pay

## 2012-03-10 ENCOUNTER — Ambulatory Visit (HOSPITAL_COMMUNITY): Payer: Self-pay | Attending: Cardiology | Admitting: Radiology

## 2012-03-10 VITALS — BP 146/84 | Ht 67.0 in | Wt 243.0 lb

## 2012-03-10 DIAGNOSIS — R0602 Shortness of breath: Secondary | ICD-10-CM | POA: Insufficient documentation

## 2012-03-10 DIAGNOSIS — R0989 Other specified symptoms and signs involving the circulatory and respiratory systems: Secondary | ICD-10-CM | POA: Insufficient documentation

## 2012-03-10 DIAGNOSIS — R0609 Other forms of dyspnea: Secondary | ICD-10-CM | POA: Insufficient documentation

## 2012-03-10 DIAGNOSIS — R079 Chest pain, unspecified: Secondary | ICD-10-CM | POA: Insufficient documentation

## 2012-03-10 DIAGNOSIS — E785 Hyperlipidemia, unspecified: Secondary | ICD-10-CM | POA: Insufficient documentation

## 2012-03-10 DIAGNOSIS — E7800 Pure hypercholesterolemia, unspecified: Secondary | ICD-10-CM

## 2012-03-10 DIAGNOSIS — E669 Obesity, unspecified: Secondary | ICD-10-CM | POA: Insufficient documentation

## 2012-03-10 DIAGNOSIS — I252 Old myocardial infarction: Secondary | ICD-10-CM | POA: Insufficient documentation

## 2012-03-10 DIAGNOSIS — I10 Essential (primary) hypertension: Secondary | ICD-10-CM | POA: Insufficient documentation

## 2012-03-10 DIAGNOSIS — I4891 Unspecified atrial fibrillation: Secondary | ICD-10-CM

## 2012-03-10 DIAGNOSIS — I2510 Atherosclerotic heart disease of native coronary artery without angina pectoris: Secondary | ICD-10-CM

## 2012-03-10 DIAGNOSIS — Z951 Presence of aortocoronary bypass graft: Secondary | ICD-10-CM | POA: Insufficient documentation

## 2012-03-10 DIAGNOSIS — Z8249 Family history of ischemic heart disease and other diseases of the circulatory system: Secondary | ICD-10-CM | POA: Insufficient documentation

## 2012-03-10 DIAGNOSIS — Z87891 Personal history of nicotine dependence: Secondary | ICD-10-CM | POA: Insufficient documentation

## 2012-03-10 LAB — LIPID PANEL
Cholesterol: 145 mg/dL (ref 0–200)
LDL Cholesterol: 73 mg/dL (ref 0–99)
VLDL: 37 mg/dL (ref 0.0–40.0)

## 2012-03-10 MED ORDER — TECHNETIUM TC 99M TETROFOSMIN IV KIT
10.0000 | PACK | Freq: Once | INTRAVENOUS | Status: AC | PRN
  Administered 2012-03-10: 10 via INTRAVENOUS

## 2012-03-10 MED ORDER — TECHNETIUM TC 99M TETROFOSMIN IV KIT
30.0000 | PACK | Freq: Once | INTRAVENOUS | Status: AC | PRN
  Administered 2012-03-10: 30 via INTRAVENOUS

## 2012-03-10 MED ORDER — REGADENOSON 0.4 MG/5ML IV SOLN
0.4000 mg | Freq: Once | INTRAVENOUS | Status: AC
  Administered 2012-03-10: 0.4 mg via INTRAVENOUS

## 2012-03-10 NOTE — Progress Notes (Signed)
[facility] SITE 3 NUCLEAR MED [address] [place] [address] [phone]  Cardiology Nuclear Med Study  Clinton Baldwin is a 61 y.o. male     MRN : [id number]     DOB: 28-Mar-1951  Procedure Date: 03/10/2012  Nuclear Med Background Indication for Stress Test:  Evaluation for Ischemia and Graft Patency History:  '00 OZH:YQMVHQ per pt.; '10 NSTEMI>CABG with post op afib Cardiac Risk Factors: Family History - CAD, History of Smoking, Hypertension, Lipids and Obesity  Symptoms:  Chest Pain (last episode of chest discomfort was about one month ago) and DOE   Nuclear Pre-Procedure Caffeine/Decaff Intake:  None NPO After: 7:00pm   Lungs:  clear O2 Sat: 98% on room air. IV 0.9% NS with Angio Cath:  22g  IV Site: R Wrist  IV Started by:  Stanton Kidney, EMT-P  Chest Size (in):  50 Cup Size: n/a  Height: 5\' 7"  (1.702 m)  Weight:  243 lb (110.224 kg)  BMI:  Body mass index is 38.06 kg/(m^2). Tech Comments:  Meds were taken at 8am as directed, per patient.    Nuclear Med Study 1 or 2 day study: 1 day  Stress Test Type:  Eugenie Birks  Reading MD: Olga Millers, MD  Order Authorizing Provider:  Rollene Rotunda, MD  Resting Radionuclide: Technetium 27m Tetrofosmin  Resting Radionuclide Dose: 11.0 mCi   Stress Radionuclide:  Technetium 82m Tetrofosmin  Stress Radionuclide Dose: 33.0 mCi           Stress Protocol Rest HR: 57 Stress HR: 121  Rest BP: 146/84 Stress BP: 153/89  Exercise Time (min): n/a METS: n/a   Predicted Max HR: 158 bpm % Max HR: 76.58 bpm Rate Pressure Product: 46962   Dose of Adenosine (mg):  n/a Dose of Lexiscan: 0.4 mg  Dose of Atropine (mg): n/a Dose of Dobutamine: n/a mcg/kg/min (at max HR)  Stress Test Technologist: Smiley Houseman, CMA-N  Nuclear Technologist:  Domenic Polite, CNMT     Rest Procedure:  Myocardial perfusion imaging was performed at rest 45 minutes following the intravenous administration of Technetium 62m Tetrofosmin.  Rest  ECG: Nonspecific T-wave changes with occasional PVC.  Stress Procedure:  The patient received IV Lexiscan 0.4 mg over 15-seconds.  Technetium 56m Tetrofosmin injected at 30-seconds.  There were no significant changes with Lexiscan bolus, rare PVC.  Quantitative spect images were obtained after a 45 minute delay.  Stress ECG: No significant ST segment change suggestive of ischemia.  QPS Raw Data Images:  Acquisition technically good; normal left ventricular size. Stress Images:  There is decreased uptake in the inferior wall. Rest Images:  There is decreased uptake in the inferior wall. Subtraction (SDS):  There is a fixed defect that is most consistent with a previous infarction. Transient Ischemic Dilatation (Normal <1.22):  1.10 Lung/Heart Ratio (Normal <0.45):  0.33  Quantitative Gated Spect Images QGS EDV:  99 ml QGS ESV:  46 ml  Impression Exercise Capacity:  Lexiscan with no exercise. BP Response:  Normal blood pressure response. Clinical Symptoms:  There is dyspnea. ECG Impression:  No significant ST segment change suggestive of ischemia. Comparison with Prior Nuclear Study: No previous nuclear study performed  Overall Impression:  Abnormal stress nuclear study with a medium size, severe intensity, fixed defect in the inferior wall consistent with prior infarct; no ischemia.  LV Ejection Fraction: 53%.  LV Wall Motion:  Inferobasal akinesis.  Olga Millers

## 2014-01-30 ENCOUNTER — Observation Stay (HOSPITAL_COMMUNITY)
Admission: EM | Admit: 2014-01-30 | Discharge: 2014-01-31 | Disposition: A | Discharge status: Home / Self Care | Payer: Non-veteran care | Attending: Internal Medicine | Admitting: Internal Medicine

## 2014-01-30 ENCOUNTER — Emergency Department (HOSPITAL_COMMUNITY): Payer: Non-veteran care

## 2014-01-30 ENCOUNTER — Encounter (HOSPITAL_COMMUNITY): Payer: Self-pay | Admitting: Emergency Medicine

## 2014-01-30 DIAGNOSIS — Z7982 Long term (current) use of aspirin: Secondary | ICD-10-CM | POA: Insufficient documentation

## 2014-01-30 DIAGNOSIS — R82998 Other abnormal findings in urine: Secondary | ICD-10-CM

## 2014-01-30 DIAGNOSIS — M25569 Pain in unspecified knee: Secondary | ICD-10-CM

## 2014-01-30 DIAGNOSIS — B353 Tinea pedis: Secondary | ICD-10-CM

## 2014-01-30 DIAGNOSIS — L301 Dyshidrosis [pompholyx]: Secondary | ICD-10-CM | POA: Insufficient documentation

## 2014-01-30 DIAGNOSIS — G5600 Carpal tunnel syndrome, unspecified upper limb: Secondary | ICD-10-CM | POA: Insufficient documentation

## 2014-01-30 DIAGNOSIS — R609 Edema, unspecified: Secondary | ICD-10-CM

## 2014-01-30 DIAGNOSIS — Z9189 Other specified personal risk factors, not elsewhere classified: Secondary | ICD-10-CM

## 2014-01-30 DIAGNOSIS — E785 Hyperlipidemia, unspecified: Secondary | ICD-10-CM

## 2014-01-30 DIAGNOSIS — Z86711 Personal history of pulmonary embolism: Secondary | ICD-10-CM | POA: Insufficient documentation

## 2014-01-30 DIAGNOSIS — M2340 Loose body in knee, unspecified knee: Secondary | ICD-10-CM

## 2014-01-30 DIAGNOSIS — D649 Anemia, unspecified: Secondary | ICD-10-CM

## 2014-01-30 DIAGNOSIS — Z79899 Other long term (current) drug therapy: Secondary | ICD-10-CM | POA: Insufficient documentation

## 2014-01-30 DIAGNOSIS — Z9861 Coronary angioplasty status: Secondary | ICD-10-CM

## 2014-01-30 DIAGNOSIS — R0609 Other forms of dyspnea: Secondary | ICD-10-CM | POA: Insufficient documentation

## 2014-01-30 DIAGNOSIS — M2390 Unspecified internal derangement of unspecified knee: Secondary | ICD-10-CM

## 2014-01-30 DIAGNOSIS — R0789 Other chest pain: Principal | ICD-10-CM | POA: Insufficient documentation

## 2014-01-30 DIAGNOSIS — R079 Chest pain, unspecified: Secondary | ICD-10-CM

## 2014-01-30 DIAGNOSIS — I2510 Atherosclerotic heart disease of native coronary artery without angina pectoris: Secondary | ICD-10-CM

## 2014-01-30 DIAGNOSIS — R0600 Dyspnea, unspecified: Secondary | ICD-10-CM

## 2014-01-30 DIAGNOSIS — M069 Rheumatoid arthritis, unspecified: Secondary | ICD-10-CM | POA: Insufficient documentation

## 2014-01-30 DIAGNOSIS — M79609 Pain in unspecified limb: Secondary | ICD-10-CM

## 2014-01-30 DIAGNOSIS — Z791 Long term (current) use of non-steroidal anti-inflammatories (NSAID): Secondary | ICD-10-CM | POA: Insufficient documentation

## 2014-01-30 DIAGNOSIS — I10 Essential (primary) hypertension: Secondary | ICD-10-CM

## 2014-01-30 DIAGNOSIS — R0989 Other specified symptoms and signs involving the circulatory and respiratory systems: Secondary | ICD-10-CM | POA: Insufficient documentation

## 2014-01-30 DIAGNOSIS — K219 Gastro-esophageal reflux disease without esophagitis: Secondary | ICD-10-CM

## 2014-01-30 DIAGNOSIS — I4891 Unspecified atrial fibrillation: Secondary | ICD-10-CM

## 2014-01-30 DIAGNOSIS — D72829 Elevated white blood cell count, unspecified: Secondary | ICD-10-CM

## 2014-01-30 DIAGNOSIS — Z951 Presence of aortocoronary bypass graft: Secondary | ICD-10-CM | POA: Insufficient documentation

## 2014-01-30 DIAGNOSIS — Z87891 Personal history of nicotine dependence: Secondary | ICD-10-CM

## 2014-01-30 HISTORY — DX: Carpal tunnel syndrome, unspecified upper limb: G56.00

## 2014-01-30 HISTORY — DX: Rheumatoid arthritis, unspecified: M06.9

## 2014-01-30 LAB — BASIC METABOLIC PANEL
BUN: 21 mg/dL (ref 6–23)
CO2: 22 mEq/L (ref 19–32)
Calcium: 8.7 mg/dL (ref 8.4–10.5)
Chloride: 101 mEq/L (ref 96–112)
Creatinine, Ser: 1.32 mg/dL (ref 0.50–1.35)
GFR calc Af Amer: 64 mL/min — ABNORMAL LOW (ref >90)
GFR calc non Af Amer: 55 mL/min — ABNORMAL LOW (ref >90)
GLUCOSE: 122 mg/dL — AB (ref 70–99)
POTASSIUM: 3.7 meq/L (ref 3.7–5.3)
Sodium: 141 mEq/L (ref 137–147)

## 2014-01-30 LAB — CBC
HEMATOCRIT: 44.1 % (ref 39.0–52.0)
HEMOGLOBIN: 15 g/dL (ref 13.0–17.0)
MCH: 31.8 pg (ref 26.0–34.0)
MCHC: 34 g/dL (ref 30.0–36.0)
MCV: 93.4 fL (ref 78.0–100.0)
Platelets: 239 10*3/uL (ref 150–400)
RBC: 4.72 MIL/uL (ref 4.22–5.81)
RDW: 16.3 % — ABNORMAL HIGH (ref 11.5–15.5)
WBC: 17.1 10*3/uL — ABNORMAL HIGH (ref 4.0–10.5)

## 2014-01-30 LAB — PRO B NATRIURETIC PEPTIDE: Pro B Natriuretic peptide (BNP): 784.7 pg/mL — ABNORMAL HIGH (ref 0–125)

## 2014-01-30 LAB — POCT I-STAT TROPONIN I: Troponin i, poc: 0.01 ng/mL (ref 0.00–0.08)

## 2014-01-30 NOTE — ED Notes (Signed)
Pain free at present.  Pain comes and goes.  VSS.

## 2014-01-30 NOTE — ED Notes (Signed)
MD at bedside. 

## 2014-01-30 NOTE — ED Notes (Signed)
Holding left fist to chest which, per pt report, makes pain better.  Tried to fix a door today, sat down, left chest pain began hurting.  Pain does not radiate, denies dyspnea.  Minor diaphoresis at that time.

## 2014-01-30 NOTE — ED Notes (Signed)
C/o stabbing L sided chest pain x 2 hours that is worse with deep inspiration.  Denies nausea, vomiting.

## 2014-01-30 NOTE — ED Provider Notes (Signed)
CSN: [id number]     Arrival date & time 01/30/14  2223 History   First MD Initiated Contact with Patient 01/30/14 2302     Chief Complaint  Patient presents with  . Chest Pain     (Consider location/radiation/quality/duration/timing/severity/associated sxs/prior Treatment) Patient is a 63 y.o. male presenting with chest pain. The history is provided by the patient.  Chest Pain He onset at about 9:30 PM of a sharp left anterior chest pain without radiation.. The pain at 6/10. Pain was worse with deep breath but not with movement or exertion. There is mild associated dyspnea and also mild diaphoresis but no nausea. He also had a similar pain earlier in the day when he was doing some work on a door and, on further questioning, he has been having similar pains intermittently for the past year. Pains are not exertional and generally only lasts a brief amount of time. He has not discussed this with his physician. He is status post coronary artery bypass in 2010. He takes aspirin 325 mg daily and did take a dose today. He also has rheumatoid arthritis.  Past Medical History  Diagnosis Date  . Hypertension   . Dyslipidemia   . GERD (gastroesophageal reflux disease)   . History of pulmonary embolism     years ago  . Atrial fibrillation     post op  . Coronary artery disease     s/p STEMI 11/2009  . 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 Leukocytosis     referred to hematology  . Rheumatoid arthritis   . Carpal tunnel syndrome    Past Surgical History  Procedure Laterality Date  . Coronary artery bypass graft  11/30/2009    x5. LIMA to LAD, SVG to diagonal branch of LAD, SVG sequentially to OM2 and 3, SVG to distal RCA with EVH from the right leg    Family History  Problem Relation Age of Onset  . Diabetes Mother   . Heart attack Brother 9  . Heart attack Brother 50   History  Substance Use Topics  . Smoking status: Former Research scientist (life sciences)  . Smokeless  tobacco: Not on file  . Alcohol Use: No    Review of Systems  Cardiovascular: Positive for chest pain.  All other systems reviewed and are negative.      Allergies  Review of patient's allergies indicates no known allergies.  Home Medications   Current Outpatient Rx  Name  Route  Sig  Dispense  Refill  . aspirin 325 MG tablet   Oral   Take 325 mg by mouth daily.           . Ferrous Sulfate (IRON) 325 (65 FE) MG TABS   Oral   Take 1 tablet by mouth daily.           . hydrochlorothiazide (HYDRODIURIL) 25 MG tablet      TAKE ONE TABLET BY MOUTH EVERY DAY   30 tablet   1   . lisinopril (PRINIVIL,ZESTRIL) 40 MG tablet   Oral   Take 40 mg by mouth daily.           . meloxicam (MOBIC) 15 MG tablet   Oral   Take 15 mg by mouth daily.         . metoprolol tartrate (LOPRESSOR) 25 MG tablet   Oral   Take 25 mg by mouth 2 (two) times daily.           Marland Kitchen  pravastatin (PRAVACHOL) 20 MG tablet      TAKE ONE TABLET BY MOUTH EVERY DAY AT BEDTIME   30 tablet   1   . terbinafine (LAMISIL) 1 % cream   Topical   Apply 1 application topically 2 (two) times daily.            BP 173/81  Pulse 81  Temp(Src) 97.6 F (36.4 C) (Oral)  Resp 20  SpO2 94% Physical Exam  Nursing note and vitals reviewed.  63 year old male, resting comfortably and in no acute distress. Vital signs are significant for hypertension with blood pressure 173/81. Oxygen saturation is 94%, which is normal. Head is normocephalic and atraumatic. PERRLA, EOMI. Oropharynx is clear. Neck is nontender and supple without adenopathy or JVD. Back is nontender and there is no CVA tenderness. Lungs are clear without rales, wheezes, or rhonchi. Chest is mildly tender along the lower left costal margin. Heart has regular rate and rhythm without murmur. Abdomen is soft, flat, nontender without masses or hepatosplenomegaly and peristalsis is normoactive. Extremities have 1+ edema, full range of motion is  present. Skin is warm and dry without rash. Neurologic: Mental status is normal, cranial nerves are intact, there are no motor or sensory deficits.  ED Course  Procedures (including critical care time) Labs Review Results for orders placed during the hospital encounter of 01/30/14  CBC      Result Value Ref Range   WBC 17.1 (*) 4.0 - 10.5 K/uL   RBC 4.72  4.22 - 5.81 MIL/uL   Hemoglobin 15.0  13.0 - 17.0 g/dL   HCT 44.1  39.0 - 52.0 %   MCV 93.4  78.0 - 100.0 fL   MCH 31.8  26.0 - 34.0 pg   MCHC 34.0  30.0 - 36.0 g/dL   RDW 16.3 (*) 11.5 - 15.5 %   Platelets 239  150 - 400 K/uL  BASIC METABOLIC PANEL      Result Value Ref Range   Sodium 141  137 - 147 mEq/L   Potassium 3.7  3.7 - 5.3 mEq/L   Chloride 101  96 - 112 mEq/L   CO2 22  19 - 32 mEq/L   Glucose, Bld 122 (*) 70 - 99 mg/dL   BUN 21  6 - 23 mg/dL   Creatinine, Ser 1.32  0.50 - 1.35 mg/dL   Calcium 8.7  8.4 - 10.5 mg/dL   GFR calc non Af Amer 55 (*) >90 mL/min   GFR calc Af Amer 64 (*) >90 mL/min  PRO B NATRIURETIC PEPTIDE      Result Value Ref Range   Pro B Natriuretic peptide (BNP) 784.7 (*) 0 - 125 pg/mL  D-DIMER, QUANTITATIVE      Result Value Ref Range   D-Dimer, Quant <0.27  0.00 - 0.48 ug/mL-FEU  POCT I-STAT TROPONIN I      Result Value Ref Range   Troponin i, poc 0.01  0.00 - 0.08 ng/mL   Comment 3            Imaging Review Dg Chest Port 1 View  01/30/2014   CLINICAL DATA:  Left chest pain.  EXAM: PORTABLE CHEST - 1 VIEW  COMPARISON:  02/14/2011  FINDINGS: Prior CABG. There is hyperinflation of the lungs compatible with COPD. Cardiomegaly. No confluent opacities or effusions. No acute bony abnormality.  IMPRESSION: Cardiomegaly, COPD.  No active disease.   Electronically Signed   By: Rolm Baptise M.D.   On: 01/30/2014 23:52  EKG Interpretation    Date/Time:  Sunday January 30 2014 22:26:16 EST Ventricular Rate:  71 PR Interval:  148 QRS Duration: 86 QT Interval:  400 QTC Calculation: 434 R  Axis:   42 Text Interpretation:  Normal sinus rhythm Cannot rule out Inferior infarct , age undetermined Abnormal ECG Since last tracing Nonspecific T wave abnormality NOW PRESENT Confirmed by Karle Starch  MD, CHARLES [id number]) on 01/30/2014 10:37:59 PM            MDM   Final diagnoses:  Chest pain    Chest pain of uncertain cause. He has known coronary artery disease and in review of past records, he had a recommendation to have a stress test done during an office visit 2 years ago. He has not had that stress test done he. However, character of pain makes it more likely that it  Is related to his rheumatoid arthritis. ECG does show ST depression and T wave inversion in the anterior leads which is new compared with ECG from 2013, but T wave inversions in the anterolateral leads have resolved. Initial troponin is normal. It is noted that he has a history of pulmonary embolism, so he will be screened with d-dimer.  He ED workup has been unremarkable. D-dimer is normal. He has had no further episodes of chest pain. Case is discussed with Dr. Roel Cluck of [facility] who agrees to admit the patient. Consideration should be given to cardiology consultation since he had failed to follow through with request for his stress test when seen in the cardiology office 2 years ago.  Delora Fuel, MD [id number]

## 2014-01-31 ENCOUNTER — Encounter (HOSPITAL_COMMUNITY): Payer: Self-pay | Admitting: Emergency Medicine

## 2014-01-31 DIAGNOSIS — I10 Essential (primary) hypertension: Secondary | ICD-10-CM

## 2014-01-31 DIAGNOSIS — E785 Hyperlipidemia, unspecified: Secondary | ICD-10-CM

## 2014-01-31 DIAGNOSIS — I2510 Atherosclerotic heart disease of native coronary artery without angina pectoris: Secondary | ICD-10-CM

## 2014-01-31 DIAGNOSIS — R079 Chest pain, unspecified: Secondary | ICD-10-CM | POA: Diagnosis present

## 2014-01-31 LAB — LIPID PANEL
CHOL/HDL RATIO: 2.7 ratio
Cholesterol: 165 mg/dL (ref 0–200)
HDL: 61 mg/dL (ref >39)
LDL CALC: 69 mg/dL (ref 0–99)
Triglycerides: 174 mg/dL — ABNORMAL HIGH (ref <150)
VLDL: 35 mg/dL (ref 0–40)

## 2014-01-31 LAB — COMPREHENSIVE METABOLIC PANEL
ALBUMIN: 3.5 g/dL (ref 3.5–5.2)
ALT: 19 U/L (ref 0–53)
AST: 14 U/L (ref 0–37)
Alkaline Phosphatase: 49 U/L (ref 39–117)
BILIRUBIN TOTAL: 0.2 mg/dL — AB (ref 0.3–1.2)
BUN: 22 mg/dL (ref 6–23)
CHLORIDE: 99 meq/L (ref 96–112)
CO2: 29 mEq/L (ref 19–32)
CREATININE: 1.36 mg/dL — AB (ref 0.50–1.35)
Calcium: 9.1 mg/dL (ref 8.4–10.5)
GFR calc Af Amer: 62 mL/min — ABNORMAL LOW (ref >90)
GFR calc non Af Amer: 53 mL/min — ABNORMAL LOW (ref >90)
Glucose, Bld: 96 mg/dL (ref 70–99)
Potassium: 4 mEq/L (ref 3.7–5.3)
Sodium: 141 mEq/L (ref 137–147)
Total Protein: 7.1 g/dL (ref 6.0–8.3)

## 2014-01-31 LAB — CBC
HCT: 44.7 % (ref 39.0–52.0)
HEMOGLOBIN: 15.1 g/dL (ref 13.0–17.0)
MCH: 31.7 pg (ref 26.0–34.0)
MCHC: 33.8 g/dL (ref 30.0–36.0)
MCV: 93.9 fL (ref 78.0–100.0)
PLATELETS: 230 10*3/uL (ref 150–400)
RBC: 4.76 MIL/uL (ref 4.22–5.81)
RDW: 16.5 % — ABNORMAL HIGH (ref 11.5–15.5)
WBC: 17.7 10*3/uL — ABNORMAL HIGH (ref 4.0–10.5)

## 2014-01-31 LAB — TSH: TSH: 1.557 u[IU]/mL (ref 0.350–4.500)

## 2014-01-31 LAB — D-DIMER, QUANTITATIVE: D-Dimer, Quant: <0.27 ug/mL-FEU (ref 0.00–0.48)

## 2014-01-31 LAB — TROPONIN I
Troponin I: <0.3 ng/mL (ref <0.30)
Troponin I: <0.3 ng/mL (ref <0.30)

## 2014-01-31 LAB — MAGNESIUM: Magnesium: 1.7 mg/dL (ref 1.5–2.5)

## 2014-01-31 LAB — PHOSPHORUS: PHOSPHORUS: 4.3 mg/dL (ref 2.3–4.6)

## 2014-01-31 MED ORDER — ACETAMINOPHEN 650 MG RE SUPP
650.0000 mg | Freq: Four times a day (QID) | RECTAL | Status: DC | PRN
Start: 2014-01-31 — End: 2014-01-31

## 2014-01-31 MED ORDER — ONDANSETRON HCL 4 MG PO TABS
4.0000 mg | ORAL_TABLET | Freq: Four times a day (QID) | ORAL | Status: DC | PRN
Start: 2014-01-31 — End: 2014-01-31

## 2014-01-31 MED ORDER — PREDNISONE 10 MG PO TABS
10.0000 mg | ORAL_TABLET | Freq: Every day | ORAL | Status: DC
Start: 2014-01-31 — End: 2014-01-31
  Administered 2014-01-31: 10 mg via ORAL
  Filled 2014-01-31 (×2): qty 1

## 2014-01-31 MED ORDER — SODIUM CHLORIDE 0.9 % IJ SOLN: 3.0000 mL | INTRAMUSCULAR | Status: DC | PRN

## 2014-01-31 MED ORDER — ASPIRIN 325 MG PO TABS
325.0000 mg | ORAL_TABLET | Freq: Every day | ORAL | Status: DC
Start: 2014-01-31 — End: 2014-01-31
  Administered 2014-01-31: 325 mg via ORAL
  Filled 2014-01-31: qty 1

## 2014-01-31 MED ORDER — LISINOPRIL 40 MG PO TABS
40.0000 mg | ORAL_TABLET | Freq: Every day | ORAL | Status: DC
  Administered 2014-01-31: 40 mg via ORAL
  Filled 2014-01-31: qty 1

## 2014-01-31 MED ORDER — ISOSORBIDE MONONITRATE ER 30 MG PO TB24
30.0000 mg | ORAL_TABLET | Freq: Every day | ORAL | Status: DC
  Filled 2014-01-31: qty 1

## 2014-01-31 MED ORDER — TRAMADOL HCL 50 MG PO TABS: 100.0000 mg | ORAL_TABLET | Freq: Four times a day (QID) | ORAL | Status: DC | PRN

## 2014-01-31 MED ORDER — PANTOPRAZOLE SODIUM 40 MG PO TBEC
40.0000 mg | DELAYED_RELEASE_TABLET | Freq: Every day | ORAL | Status: DC
  Administered 2014-01-31: 40 mg via ORAL
  Filled 2014-01-31: qty 1

## 2014-01-31 MED ORDER — SODIUM CHLORIDE 0.9 % IJ SOLN: 3.0000 mL | Freq: Two times a day (BID) | INTRAMUSCULAR | Status: DC

## 2014-01-31 MED ORDER — ONDANSETRON HCL 4 MG/2ML IJ SOLN: 4.0000 mg | Freq: Four times a day (QID) | INTRAMUSCULAR | Status: DC | PRN

## 2014-01-31 MED ORDER — METOPROLOL TARTRATE 25 MG PO TABS
25.0000 mg | ORAL_TABLET | Freq: Two times a day (BID) | ORAL | Status: DC
  Administered 2014-01-31: 25 mg via ORAL
  Filled 2014-01-31 (×2): qty 1

## 2014-01-31 MED ORDER — HYDROCHLOROTHIAZIDE 12.5 MG PO CAPS
12.5000 mg | ORAL_CAPSULE | Freq: Every day | ORAL | Status: DC
  Administered 2014-01-31: 12.5 mg via ORAL
  Filled 2014-01-31: qty 1

## 2014-01-31 MED ORDER — ENOXAPARIN SODIUM 40 MG/0.4ML ~~LOC~~ SOLN
40.0000 mg | SUBCUTANEOUS | Status: DC
  Filled 2014-01-31: qty 0.4

## 2014-01-31 MED ORDER — SIMVASTATIN 10 MG PO TABS
10.0000 mg | ORAL_TABLET | Freq: Every day | ORAL | Status: DC
  Filled 2014-01-31: qty 1

## 2014-01-31 MED ORDER — SODIUM CHLORIDE 0.9 % IJ SOLN
3.0000 mL | Freq: Two times a day (BID) | INTRAMUSCULAR | Status: DC
  Administered 2014-01-31: 3 mL via INTRAVENOUS

## 2014-01-31 MED ORDER — SODIUM CHLORIDE 0.9 % IV SOLN: 250.0000 mL | INTRAVENOUS | Status: DC | PRN

## 2014-01-31 MED ORDER — ISOSORBIDE MONONITRATE ER 30 MG PO TB24: 30.0000 mg | ORAL_TABLET | Freq: Every day | ORAL | Status: AC

## 2014-01-31 MED ORDER — HYDROCODONE-ACETAMINOPHEN 5-325 MG PO TABS: 1.0000 | ORAL_TABLET | ORAL | Status: DC | PRN

## 2014-01-31 MED ORDER — ACETAMINOPHEN 325 MG PO TABS
650.0000 mg | ORAL_TABLET | Freq: Four times a day (QID) | ORAL | Status: DC | PRN
Start: 2014-01-31 — End: 2014-01-31

## 2014-01-31 MED ORDER — DOCUSATE SODIUM 100 MG PO CAPS
100.0000 mg | ORAL_CAPSULE | Freq: Two times a day (BID) | ORAL | Status: DC
  Administered 2014-01-31: 100 mg via ORAL
  Filled 2014-01-31 (×2): qty 1

## 2014-01-31 NOTE — Plan of Care (Signed)
Problem: Phase II Progression Outcomes Goal: Stress Test if indicated Outcome: Completed/Met Date Met:  01/31/14 outpt myoview to be scheduled

## 2014-01-31 NOTE — Plan of Care (Signed)
D/c orders received;IV removed with gauze on, pt remains in stable condition, pt meds and instructions reviewed and given to pt; reminded pt that he needed to have outpt stress test; pt d/c to home

## 2014-01-31 NOTE — Discharge Summary (Signed)
PATIENT DETAILS Name: Clinton Baldwin Age: 63 y.o. Sex: male Date of Birth: 06/25/1951 MRN: [id number]. Admit Date: 01/30/2014 Admitting Physician: Toy Baker, MD CVE:[id number], Beverly Gust, MD  Recommendations for Outpatient Follow-up:  1. Followup with cardiology for outpatient stress test.  PRIMARY DISCHARGE DIAGNOSIS:  Active Problems:   LEUKOCYTOSIS   Essential hypertension, benign   CAD   Atrial fibrillation   Chest pain      PAST MEDICAL HISTORY: Past Medical History  Diagnosis Date  . Hypertension   . Dyslipidemia   . GERD (gastroesophageal reflux disease)   . History of pulmonary embolism     years ago  . Atrial fibrillation     post op  . Coronary artery disease     s/p STEMI 11/2009  . 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 Leukocytosis     referred to hematology  . Rheumatoid arthritis   . Carpal tunnel syndrome   . Myocardial infarction   . Anginal pain   . Dysrhythmia     DISCHARGE MEDICATIONS:   Medication List         aspirin 325 MG tablet  Take 325 mg by mouth daily.     folic acid 1 MG tablet  Commonly known as:  FOLVITE  Take 1 mg by mouth daily.     hydrochlorothiazide 12.5 MG capsule  Commonly known as:  MICROZIDE  Take 12.5 mg by mouth daily.     Iron 325 (65 FE) MG Tabs  Take 1 tablet by mouth daily.     isosorbide mononitrate 30 MG 24 hr tablet  Commonly known as:  IMDUR  Take 1 tablet (30 mg total) by mouth daily.     lisinopril 40 MG tablet  Commonly known as:  PRINIVIL,ZESTRIL  Take 40 mg by mouth daily.     methotrexate 2.5 MG tablet  Commonly known as:  RHEUMATREX  Take 15 mg by mouth once a week. Take 6 tablets on Wednesday.Caution:Chemotherapy. Protect from light.     metoprolol 50 MG tablet  Commonly known as:  LOPRESSOR  Take 25 mg by mouth 2 (two) times daily.     omeprazole 20 MG capsule  Commonly known as:  PRILOSEC  Take 20 mg by mouth daily.     pravastatin 20  MG tablet  Commonly known as:  PRAVACHOL  Take 20 mg by mouth daily.     predniSONE 10 MG tablet  Commonly known as:  DELTASONE  Take 10 mg by mouth daily with breakfast.     traMADol 50 MG tablet  Commonly known as:  ULTRAM  Take by mouth 3 (three) times daily as needed for moderate pain.     Vitamin D-3 1000 UNITS Caps  Take 1 capsule by mouth daily.        ALLERGIES:  No Known Allergies  BRIEF HPI:  See H&P, Labs, Consult and Test reports for all details in brief, patient is a 63 year old male with history of coronary artery disease status post CABG, hypertension, dyslipidemia, rheumatoid arthritis on steroids who presented with on and off chest pain for the past one year, but for the past few days it was much severe and persistent. He was then admitted for further evaluation and treatment.  CONSULTATIONS:   cardiology  PERTINENT RADIOLOGIC STUDIES: Dg Chest Port 1 View  01/30/2014   CLINICAL DATA:  Left chest pain.  EXAM: PORTABLE CHEST - 1 VIEW  COMPARISON:  02/14/2011  FINDINGS: Prior CABG. There is hyperinflation of the lungs compatible with COPD. Cardiomegaly. No confluent opacities or effusions. No acute bony abnormality.  IMPRESSION: Cardiomegaly, COPD.  No active disease.   Electronically Signed   By: Rolm Baptise M.D.   On: 01/30/2014 23:52     PERTINENT LAB RESULTS: CBC:  Recent Labs  01/30/14 2244 01/31/14 0540  WBC 17.1* 17.7*  HGB 15.0 15.1  HCT 44.1 44.7  PLT 239 230   CMET CMP     Component Value Date/Time   NA 141 01/31/2014 0540   K 4.0 01/31/2014 0540   CL 99 01/31/2014 0540   CO2 29 01/31/2014 0540   GLUCOSE 96 01/31/2014 0540   BUN 22 01/31/2014 0540   CREATININE 1.36* 01/31/2014 0540   CALCIUM 9.1 01/31/2014 0540   PROT 7.1 01/31/2014 0540   ALBUMIN 3.5 01/31/2014 0540   AST 14 01/31/2014 0540   ALT 19 01/31/2014 0540   ALKPHOS 49 01/31/2014 0540   BILITOT 0.2* 01/31/2014 0540   GFRNONAA 53* 01/31/2014 0540   GFRAA 62* 01/31/2014 0540     GFR Estimated Creatinine Clearance: 64.5 ml/min (by C-G formula based on Cr of 1.36). No results found for this basename: LIPASE, AMYLASE,  in the last 72 hours  Recent Labs  01/31/14 0540 01/31/14 1145  TROPONINI <0.30 <0.30   No components found with this basename: POCBNP,   Recent Labs  01/30/14 2323  DDIMER <0.27   No results found for this basename: HGBA1C,  in the last 72 hours  Recent Labs  01/31/14 0540  CHOL 165  HDL 61  LDLCALC 69  TRIG 174*  CHOLHDL 2.7    Recent Labs  01/31/14 0540  TSH 1.557   No results found for this basename: VITAMINB12, FOLATE, FERRITIN, TIBC, IRON, RETICCTPCT,  in the last 72 hours Coags: No results found for this basename: PT, INR,  in the last 72 hours Microbiology: No results found for this or any previous visit (from the past 240 hour(s)).   BRIEF HOSPITAL COURSE:   Chest pain - Has known history of coronary artery disease, pain is somewhat reproducible. However because of known coronary artery disease, patient was admitted, enzymes were cycled and were subsequently negative. Patient was seen by cardiology, who offered an inpatient nuclear stress test however patient refused, he will now be scheduled for an outpatient stress test later this week. He is requesting discharge. - At time of discharge, he is significantly improved with no chest pain. - He is to resume all of his prior medications, we would add Imdur on discharge at the recommendation of cardiology.  Hypertension - Continue with metoprolol, lisinopril and HCTZ. Will add Imdur on discharge  Dyslipidemia - Continue statins  History of CAD status post CABG in 2010 - Continue with aspirin, statins and beta blockers.  Rest of the patient's medical issues were stable.  TODAY-DAY OF DISCHARGE:  Subjective:   [name] today has no headache,no chest abdominal pain,no new weakness tingling or numbness, feels much better wants to go home today.    Objective:   Blood pressure 151/79, pulse 63, temperature 98.1 F (36.7 C), temperature source Oral, resp. rate 20, height 5\' 7"  (1.702 m), weight 108.636 kg (239 lb 8 oz), SpO2 98.00%. No intake or output data in the 24 hours ending 01/31/14 1306 Filed Weights   01/31/14 0543  Weight: 108.636 kg (239 lb 8 oz)    Exam Awake Alert, Oriented *3, No new F.N deficits, Normal affect  [place].AT,PERRAL Supple Neck,No JVD, No cervical lymphadenopathy appriciated.  Symmetrical Chest wall movement, Good air movement bilaterally, CTAB RRR,No Gallops,Rubs or new Murmurs, No Parasternal Heave +ve B.Sounds, Abd Soft, Non tender, No organomegaly appriciated, No rebound -guarding or rigidity. No Cyanosis, Clubbing or edema, No new Rash or bruise  DISCHARGE CONDITION: Stable  DISPOSITION: Home  DISCHARGE INSTRUCTIONS:    Activity:  As tolerated   Diet recommendation: Heart Healthy diet      Discharge Orders   Future Orders Complete By Expires   Call MD for:  persistant dizziness or light-headedness  As directed    Call MD for:  redness, tenderness, or signs of infection (pain, swelling, redness, odor or green/yellow discharge around incision site)  As directed    Diet - low sodium heart healthy  As directed    Increase activity slowly  As directed       Follow-up Information   Follow up with Minus Breeding, MD. (We will arrange for an outpatient stress test and follow-up with Dr. Percival Spanish and contact you.)    Specialty:  Cardiology   Contact information:   [address], [place] [address] [phone]       Follow up with Alessandra Grout, MD. Schedule an appointment as soon as possible for a visit in 1 week.   Contact information:   [place] [address] [phone]       Total Time spent on discharge equals 45 minutes.  SignedOren Binet 01/31/2014 1:06 PM

## 2014-01-31 NOTE — ED Notes (Signed)
Report to [employer] [facility] on 3 W.

## 2014-01-31 NOTE — Consult Note (Signed)
CARDIOLOGY CONSULT NOTE   Patient ID: Clinton Baldwin MRN: [id number], DOB/AGE: [birth year]   Admit date: 01/30/2014 Date of Consult: 01/31/2014   Primary Physician: Alessandra Grout, MD Primary Cardiologist: Dr Percival Spanish  Pt. Profile  Chest pain  Problem List  Past Medical History  Diagnosis Date  . Hypertension   . Dyslipidemia   . GERD (gastroesophageal reflux disease)   . History of pulmonary embolism     years ago  . Atrial fibrillation     post op  . Coronary artery disease     s/p STEMI 11/2009  . 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 Leukocytosis     referred to hematology  . Rheumatoid arthritis   . Carpal tunnel syndrome   . Myocardial infarction   . Anginal pain   . Dysrhythmia     Past Surgical History  Procedure Laterality Date  . Coronary artery bypass graft  11/30/2009    x5. LIMA to LAD, SVG to diagonal branch of LAD, SVG sequentially to OM2 and 3, SVG to distal RCA with EVH from the right leg   . Hemorrhoid surgery       Allergies  No Known Allergies  HPI   A 63 year old male with h/o obesity, HTN, CAD, s/p CABG ( CABG x 5, LIMA to LAD, SVG to diagonal branch of LAD, SVG sequentially to OM2 and 3, SVG to distal RCA), followed by Dr Percival Spanish in the clinic, last seen on 03/02/2012 when he underwent a Lexiscan nuclear stress test with findings of an old infarct in the inferior wall and no ischemia.  He presented yesterday with a pain that was sharp, left sided, worsened with deep inspiration, but was non-exertional. He states that he has had these pains for the last year, but this one was stronger.  He ED workup has been unremarkable. D-dimer is normal. He has had no further episodes of chest pain.   Inpatient Medications  . aspirin  325 mg Oral Daily  . docusate sodium  100 mg Oral BID  . enoxaparin (LOVENOX) injection  40 mg Subcutaneous Q24H  . hydrochlorothiazide  12.5 mg Oral Daily  . lisinopril  40 mg  Oral Daily  . metoprolol  25 mg Oral BID  . pantoprazole  40 mg Oral Daily  . predniSONE  10 mg Oral Q breakfast  . simvastatin  10 mg Oral q1800  . sodium chloride  3 mL Intravenous Q12H  . sodium chloride  3 mL Intravenous Q12H    Family History Family History  Problem Relation Age of Onset  . Diabetes Mother   . Heart attack Brother 98  . Heart attack Brother 67     Social History History   Social History  . Marital Status: Married    Spouse Name: N/A    Number of Children: N/A  . Years of Education: N/A   Occupational History  . Not on file.   Social History Main Topics  . Smoking status: Former Research scientist (life sciences)  . Smokeless tobacco: Not on file  . Alcohol Use: No  . Drug Use: Yes    Special: Marijuana     Comment: occasionally uses marijuana  . Sexual Activity: Not on file   Other Topics Concern  . Not on file   Social History Narrative  . No narrative on file     Review of Systems  General:  No chills, fever, night sweats or weight  changes.  Cardiovascular:  No chest pain, dyspnea on exertion, edema, orthopnea, palpitations, paroxysmal nocturnal dyspnea. Dermatological: No rash, lesions/masses Respiratory: No cough, dyspnea Urologic: No hematuria, dysuria Abdominal:   No nausea, vomiting, diarrhea, bright red blood per rectum, melena, or hematemesis Neurologic:  No visual changes, wkns, changes in mental status. All other systems reviewed and are otherwise negative except as noted above.  Physical Exam  Blood pressure 151/79, pulse 63, temperature 98.1 F (36.7 C), temperature source Oral, resp. rate 20, height 5\' 7"  (1.702 m), weight 239 lb 8 oz (108.636 kg), SpO2 98.00%.  General: Pleasant, NAD Psych: Normal affect. Neuro: Alert and oriented X 3. Moves all extremities spontaneously. HEENT: Normal  Neck: Supple without bruits or JVD. Lungs:  Resp regular and unlabored, CTA. Heart: RRR no s3, s4, or murmurs. Abdomen: Soft, non-tender, non-distended, BS +  x 4.  Extremities: No clubbing, cyanosis or edema. DP/PT/Radials 2+ and equal bilaterally.  Labs   Recent Labs  01/31/14 0540  TROPONINI <0.30   Lab Results  Component Value Date   WBC 17.7* 01/31/2014   HGB 15.1 01/31/2014   HCT 44.7 01/31/2014   MCV 93.9 01/31/2014   PLT 230 01/31/2014    Recent Labs Lab 01/31/14 0540  NA 141  K 4.0  CL 99  CO2 29  BUN 22  CREATININE 1.36*  CALCIUM 9.1  PROT 7.1  BILITOT 0.2*  ALKPHOS 49  ALT 19  AST 14  GLUCOSE 96   Lab Results  Component Value Date   CHOL 165 01/31/2014   HDL 61 01/31/2014   LDLCALC 69 01/31/2014   TRIG 174* 01/31/2014   Lab Results  Component Value Date   DDIMER <0.27 01/30/2014   Radiology/Studies  Dg Chest Port 1 View  01/30/2014   CLINICAL DATA:  Left chest pain.  EXAM: PORTABLE CHEST - 1 VIEW  COMPARISON:  02/14/2011  FINDINGS: Prior CABG. There is hyperinflation of the lungs compatible with COPD. Cardiomegaly. No confluent opacities or effusions. No acute bony abnormality.  IMPRESSION: Cardiomegaly, COPD.  No active disease.   Electronically Signed   By: Rolm Baptise M.D.   On: 01/30/2014 23:52   Echocardiogram - none in EMR  Nuclear stress test 03/10/2012 Quantitative Gated Spect Images  QGS EDV: 99 ml  QGS ESV: 46 ml  Impression  Exercise Capacity: Lexiscan with no exercise.  BP Response: Normal blood pressure response.  Clinical Symptoms: There is dyspnea.  ECG Impression: No significant ST segment change suggestive of ischemia.  Comparison with Prior Nuclear Study: No previous nuclear study performed  Overall Impression: Abnormal stress nuclear study with a medium size, severe intensity, fixed defect in the inferior wall consistent with prior infarct; no ischemia.  LV Ejection Fraction: 53%. LV Wall Motion: Inferobasal akinesis.  Kirk Ruths  ECG: SR, negative T waves in V2-3, previously (03/02/2012) in V3-6  Telemetry: SR, no arrhythmias   ASSESSMENT AND PLAN  A 63 year old male   1.  Atypical chest pain - very atypical and chronic, possibly musculoskeletal, h/o CAD, CABG in 2010, stress test in 2013 showed inferior scar, no ischemia. Negative troponins x 2. ECG is not significantly changed (might be caused by precordial lead placement). The patient is insisting on discharge. He was offered a stress test, however ate today, he is not willing to wait till tomorrow. If the third troponin is negative, we will schedule an outpatient stress test, followed by an outpatient visit with Dr Percival Spanish.  His BP is elevated, we  will add Imdur 30 mg po daily.   Signed, Dorothy Spark, MD, [facility] 01/31/2014, 12:00 PM

## 2014-01-31 NOTE — H&P (Signed)
PCP:  Alessandra Grout, MD [facility]   Chief Complaint:  Chest pain HPI: Clinton Baldwin is a 63 y.o. male   has a past medical history of Hypertension; Dyslipidemia; GERD (gastroesophageal reflux disease); History of pulmonary embolism; Atrial fibrillation; Coronary artery disease; Bilateral knee pain; Leukocytosis; Rheumatoid arthritis; and Carpal tunnel syndrome.   Presented with  Patient has hx of occasional chest pain. He is SP CABG 2010. He was supposed to follow up with cardiology.  He had   a stress test done  In 2013 that was Abnormal stress nuclear study with a medium size, severe intensity, fixed defect in the inferior wall consistent with prior infarct; no ischemia. Patient have had intermittent chest pains for the past 1 year but has not mentioned that to his MD at [place].  Pateint states that today's pain lasted longer and was more intense. Denies shortness of breath. Pain comes and goes and only lasts a few seconds at a time. Feels like a stick jabbing into his chest. Reports that it is worse with deep breaths. Denies any travel hx, denies any leg swelling. The pleuretic component has been there for the past 1 year.  Reports being chest pain free. Hospitalist called for admision    Review of Systems:    Pertinent positives include: chest pain,  Constitutional:  No weight loss, night sweats, Fevers, chills, fatigue, weight loss  HEENT:  No headaches, Difficulty swallowing,Tooth/dental problems,Sore throat,  No sneezing, itching, ear ache, nasal congestion, post nasal drip,  Cardio-vascular:  No  Orthopnea, PND, anasarca, dizziness, palpitations.no Bilateral lower extremity swelling  GI:  No heartburn, indigestion, abdominal pain, nausea, vomiting, diarrhea, change in bowel habits, loss of appetite, melena, blood in stool, hematemesis Resp:  no shortness of breath at rest. No dyspnea on exertion, No excess mucus, no productive cough, No  non-productive cough, No coughing up of blood.No change in color of mucus.No wheezing. Skin:  no rash or lesions. No jaundice GU:  no dysuria, change in color of urine, no urgency or frequency. No straining to urinate.  No flank pain.  Musculoskeletal:  No joint pain or no joint swelling. No decreased range of motion. No back pain.  Psych:  No change in mood or affect. No depression or anxiety. No memory loss.  Neuro: no localizing neurological complaints, no tingling, no weakness, no double vision, no gait abnormality, no slurred speech, no confusion  Otherwise ROS are negative except for above, 10 systems were reviewed  Past Medical History: Past Medical History  Diagnosis Date  . Hypertension   . Dyslipidemia   . GERD (gastroesophageal reflux disease)   . History of pulmonary embolism     years ago  . Atrial fibrillation     post op  . Coronary artery disease     s/p STEMI 11/2009  . 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 Leukocytosis     referred to hematology  . Rheumatoid arthritis   . Carpal tunnel syndrome    Past Surgical History  Procedure Laterality Date  . Coronary artery bypass graft  11/30/2009    x5. LIMA to LAD, SVG to diagonal branch of LAD, SVG sequentially to OM2 and 3, SVG to distal RCA with EVH from the right leg      Medications: Prior to Admission medications   Medication Sig Start Date End Date Taking? Authorizing Provider  aspirin 325 MG tablet Take 325 mg by  mouth daily.     Yes Historical Provider, MD  Cholecalciferol (VITAMIN D-3) 1000 UNITS CAPS Take 1 capsule by mouth daily.   Yes Historical Provider, MD  Ferrous Sulfate (IRON) 325 (65 FE) MG TABS Take 1 tablet by mouth daily.     Yes Historical Provider, MD  folic acid (FOLVITE) 1 MG tablet Take 1 mg by mouth daily.   Yes Historical Provider, MD  hydrochlorothiazide (MICROZIDE) 12.5 MG capsule Take 12.5 mg by mouth daily.   Yes Historical Provider, MD   lisinopril (PRINIVIL,ZESTRIL) 40 MG tablet Take 40 mg by mouth daily.     Yes Historical Provider, MD  methotrexate (RHEUMATREX) 2.5 MG tablet Take 15 mg by mouth once a week. Take 6 tablets on Wednesday.Caution:Chemotherapy. Protect from light.   Yes Historical Provider, MD  metoprolol (LOPRESSOR) 50 MG tablet Take 25 mg by mouth 2 (two) times daily.   Yes Historical Provider, MD  omeprazole (PRILOSEC) 20 MG capsule Take 20 mg by mouth daily.   Yes Historical Provider, MD  pravastatin (PRAVACHOL) 20 MG tablet Take 20 mg by mouth daily.   Yes Historical Provider, MD  predniSONE (DELTASONE) 10 MG tablet Take 10 mg by mouth daily with breakfast.   Yes Historical Provider, MD  traMADol (ULTRAM) 50 MG tablet Take by mouth 3 (three) times daily as needed for moderate pain.   Yes Historical Provider, MD    Allergies:  No Known Allergies  Social History:  Ambulatory     independently   Lives at   Home with family   reports that he has quit smoking. He does not have any smokeless tobacco history on file. He reports that he uses illicit drugs (Marijuana). He reports that he does not drink alcohol.   Family History: family history includes Diabetes in his mother; Heart attack (age of onset: 34) in his brother and brother.    Physical Exam: Patient Vitals for the past 24 hrs:  BP Temp Temp src Pulse Resp SpO2  01/31/14 0154 156/82 mmHg - - 69 20 97 %  01/31/14 0115 142/90 mmHg - - 63 21 99 %  01/31/14 0030 143/83 mmHg - - 72 18 100 %  01/30/14 2345 146/77 mmHg - - 62 17 99 %  01/30/14 2315 149/81 mmHg - - 67 20 98 %  01/30/14 2239 173/81 mmHg 97.6 F (36.4 C) Oral 81 20 94 %    1. General:  in No Acute distress 2. Psychological: Alert and  Oriented 3. Head/ENT:   Moist  Mucous Membranes                          Head Non traumatic, neck supple                          Normal  Dentition 4. SKIN: normal  Skin turgor,  Skin clean Dry and intact no rash 5. Heart: Regular rate and rhythm no  Murmur, Rub or gallop 6. Lungs: Clear to auscultation bilaterally, no wheezes or crackles   7. Abdomen: Soft, non-tender, Non distended 8. Lower extremities: no clubbing, cyanosis, or edema 9. Neurologically Grossly intact, moving all 4 extremities equally 10. MSK: Normal range of motion, chest pain reproducible by palpation  body mass index is unknown because there is no weight on file.   Labs on Admission:   Recent Labs  01/30/14 2244  NA 141  K 3.7  CL 101  CO2 22  GLUCOSE 122*  BUN 21  CREATININE 1.32  CALCIUM 8.7   No results found for this basename: AST, ALT, ALKPHOS, BILITOT, PROT, ALBUMIN,  in the last 72 hours No results found for this basename: LIPASE, AMYLASE,  in the last 72 hours  Recent Labs  01/30/14 2244  WBC 17.1*  HGB 15.0  HCT 44.1  MCV 93.4  PLT 239   No results found for this basename: CKTOTAL, CKMB, CKMBINDEX, TROPONINI,  in the last 72 hours No results found for this basename: TSH, T4TOTAL, FREET3, T3FREE, THYROIDAB,  in the last 72 hours No results found for this basename: VITAMINB12, FOLATE, FERRITIN, TIBC, IRON, RETICCTPCT,  in the last 72 hours Lab Results  Component Value Date   HGBA1C  Value: 5.7 (NOTE) The ADA recommends the following therapeutic goal for glycemic control related to Hgb A1c measurement: Goal of therapy: <6.5 Hgb A1c  Reference: American Diabetes Association: Clinical Practice Recommendations 2010, Diabetes Care, 2010, 33: (Suppl  1). 11/29/2009    The CrCl is unknown because both a height and weight (above a minimum accepted value) are required for this calculation. ABG    Component Value Date/Time   PHART 7.338* 12/02/2009 0309   HCO3 25.1* 12/02/2009 0309   TCO2 27 12/02/2009 0309   ACIDBASEDEF 1.0 12/02/2009 0309   O2SAT 66.0 12/02/2009 0309     Lab Results  Component Value Date   DDIMER <0.27 01/30/2014     Other results:  I have pearsonaly reviewed this: ECG REPORT  Rate: 71  Rhythm: NSR ST&T Change:  ST depression in V3 and V4  Cultures:    Component Value Date/Time   SDES SPUTUM 12/02/2009 [place] 12/02/2009 0938   SPECREQUEST NONE 12/02/2009 0938   SPECREQUEST NONE 12/02/2009 [place] FLORA 12/02/2009 0938   REPTSTATUS 12/02/2009 FINAL 12/02/2009 0938   REPTSTATUS 12/04/2009 FINAL 12/02/2009 0938       Radiological Exams on Admission: Dg Chest Port 1 View  01/30/2014   CLINICAL DATA:  Left chest pain.  EXAM: PORTABLE CHEST - 1 VIEW  COMPARISON:  02/14/2011  FINDINGS: Prior CABG. There is hyperinflation of the lungs compatible with COPD. Cardiomegaly. No confluent opacities or effusions. No acute bony abnormality.  IMPRESSION: Cardiomegaly, COPD.  No active disease.   Electronically Signed   By: Rolm Baptise M.D.   On: 01/30/2014 23:52    Chart has been reviewed  Assessment/Plan  63 yo M with chest pain reproducible by palpation with hx of CAD  Present on Admission:  . Chest pain - atypical, most likely musculosckeletal but given risk factors will admit and cycle CE, serial ECG . CAD - continue aspirin, betablocker, statin . Atrial fibrillation - continue aspirin 325 . Essential hypertension, benign - continue home meds . 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  I have spent a total of 55 min on this admission  Maureena Dabbs 01/31/2014, 3:04 AM

## 2015-05-23 ENCOUNTER — Emergency Department (HOSPITAL_COMMUNITY): Payer: Medicare Other

## 2015-05-23 ENCOUNTER — Other Ambulatory Visit (HOSPITAL_COMMUNITY): Payer: Self-pay

## 2015-05-23 ENCOUNTER — Encounter (HOSPITAL_COMMUNITY)
Admission: EM | Disposition: A | Discharge status: Home / Self Care | Payer: Self-pay | Source: Home / Self Care | Attending: Interventional Cardiology

## 2015-05-23 ENCOUNTER — Inpatient Hospital Stay (HOSPITAL_COMMUNITY)
Admission: EM | Admit: 2015-05-23 | Discharge: 2015-05-27 | DRG: 248 | Disposition: A | Discharge status: Home / Self Care | Payer: Medicare Other | Attending: Interventional Cardiology | Admitting: Interventional Cardiology

## 2015-05-23 ENCOUNTER — Encounter (HOSPITAL_COMMUNITY): Payer: Self-pay | Admitting: *Deleted

## 2015-05-23 DIAGNOSIS — I252 Old myocardial infarction: Secondary | ICD-10-CM

## 2015-05-23 DIAGNOSIS — Z8249 Family history of ischemic heart disease and other diseases of the circulatory system: Secondary | ICD-10-CM

## 2015-05-23 DIAGNOSIS — M069 Rheumatoid arthritis, unspecified: Secondary | ICD-10-CM | POA: Diagnosis present

## 2015-05-23 DIAGNOSIS — I2510 Atherosclerotic heart disease of native coronary artery without angina pectoris: Secondary | ICD-10-CM

## 2015-05-23 DIAGNOSIS — I2119 ST elevation (STEMI) myocardial infarction involving other coronary artery of inferior wall: Secondary | ICD-10-CM | POA: Diagnosis present

## 2015-05-23 DIAGNOSIS — M79604 Pain in right leg: Secondary | ICD-10-CM | POA: Diagnosis not present

## 2015-05-23 DIAGNOSIS — R52 Pain, unspecified: Secondary | ICD-10-CM

## 2015-05-23 DIAGNOSIS — I10 Essential (primary) hypertension: Secondary | ICD-10-CM | POA: Diagnosis present

## 2015-05-23 DIAGNOSIS — I4891 Unspecified atrial fibrillation: Secondary | ICD-10-CM | POA: Diagnosis present

## 2015-05-23 DIAGNOSIS — Z9861 Coronary angioplasty status: Secondary | ICD-10-CM

## 2015-05-23 DIAGNOSIS — Y832 Surgical operation with anastomosis, bypass or graft as the cause of abnormal reaction of the patient, or of later complication, without mention of misadventure at the time of the procedure: Secondary | ICD-10-CM | POA: Diagnosis present

## 2015-05-23 DIAGNOSIS — Z7982 Long term (current) use of aspirin: Secondary | ICD-10-CM

## 2015-05-23 DIAGNOSIS — I25119 Atherosclerotic heart disease of native coronary artery with unspecified angina pectoris: Secondary | ICD-10-CM | POA: Diagnosis present

## 2015-05-23 DIAGNOSIS — E785 Hyperlipidemia, unspecified: Secondary | ICD-10-CM | POA: Diagnosis present

## 2015-05-23 DIAGNOSIS — I2121 ST elevation (STEMI) myocardial infarction involving left circumflex coronary artery: Secondary | ICD-10-CM

## 2015-05-23 DIAGNOSIS — I25700 Atherosclerosis of coronary artery bypass graft(s), unspecified, with unstable angina pectoris: Secondary | ICD-10-CM | POA: Diagnosis not present

## 2015-05-23 DIAGNOSIS — Z951 Presence of aortocoronary bypass graft: Secondary | ICD-10-CM

## 2015-05-23 DIAGNOSIS — I213 ST elevation (STEMI) myocardial infarction of unspecified site: Secondary | ICD-10-CM

## 2015-05-23 DIAGNOSIS — Z79899 Other long term (current) drug therapy: Secondary | ICD-10-CM | POA: Diagnosis not present

## 2015-05-23 DIAGNOSIS — Z6836 Body mass index (BMI) 36.0-36.9, adult: Secondary | ICD-10-CM | POA: Diagnosis not present

## 2015-05-23 DIAGNOSIS — T82898A Other specified complication of vascular prosthetic devices, implants and grafts, initial encounter: Secondary | ICD-10-CM | POA: Diagnosis present

## 2015-05-23 DIAGNOSIS — G8929 Other chronic pain: Secondary | ICD-10-CM | POA: Diagnosis present

## 2015-05-23 DIAGNOSIS — K219 Gastro-esophageal reflux disease without esophagitis: Secondary | ICD-10-CM | POA: Diagnosis present

## 2015-05-23 DIAGNOSIS — Z86711 Personal history of pulmonary embolism: Secondary | ICD-10-CM

## 2015-05-23 DIAGNOSIS — Z7952 Long term (current) use of systemic steroids: Secondary | ICD-10-CM

## 2015-05-23 DIAGNOSIS — M790 Rheumatism, unspecified: Secondary | ICD-10-CM | POA: Diagnosis present

## 2015-05-23 DIAGNOSIS — Z87891 Personal history of nicotine dependence: Secondary | ICD-10-CM

## 2015-05-23 HISTORY — DX: Other chronic pain: G89.29

## 2015-05-23 HISTORY — PX: CARDIAC CATHETERIZATION: SHX172

## 2015-05-23 HISTORY — DX: Morbid (severe) obesity due to excess calories: E66.01

## 2015-05-23 LAB — COMPREHENSIVE METABOLIC PANEL
ALT: 11 U/L — AB (ref 17–63)
ANION GAP: 10 (ref 5–15)
AST: 17 U/L (ref 15–41)
Albumin: 3.6 g/dL (ref 3.5–5.0)
Alkaline Phosphatase: 38 U/L (ref 38–126)
BUN: 17 mg/dL (ref 6–20)
CALCIUM: 8.7 mg/dL — AB (ref 8.9–10.3)
CHLORIDE: 104 mmol/L (ref 101–111)
CO2: 24 mmol/L (ref 22–32)
CREATININE: 1.46 mg/dL — AB (ref 0.61–1.24)
GFR calc Af Amer: 56 mL/min — ABNORMAL LOW (ref >60)
GFR, EST NON AFRICAN AMERICAN: 49 mL/min — AB (ref >60)
Glucose, Bld: 108 mg/dL — ABNORMAL HIGH (ref 65–99)
Potassium: 3.9 mmol/L (ref 3.5–5.1)
Sodium: 138 mmol/L (ref 135–145)
TOTAL PROTEIN: 6.6 g/dL (ref 6.5–8.1)
Total Bilirubin: 0.6 mg/dL (ref 0.3–1.2)

## 2015-05-23 LAB — I-STAT TROPONIN, ED: Troponin i, poc: 0.02 ng/mL (ref 0.00–0.08)

## 2015-05-23 LAB — POCT I-STAT, CHEM 8
BUN: 18 mg/dL (ref 6–20)
CALCIUM ION: 1.16 mmol/L (ref 1.13–1.30)
CREATININE: 1.4 mg/dL — AB (ref 0.61–1.24)
Chloride: 102 mmol/L (ref 101–111)
GLUCOSE: 107 mg/dL — AB (ref 65–99)
HCT: 44 % (ref 39.0–52.0)
Hemoglobin: 15 g/dL (ref 13.0–17.0)
POTASSIUM: 3.6 mmol/L (ref 3.5–5.1)
Sodium: 139 mmol/L (ref 135–145)
TCO2: 21 mmol/L (ref 0–100)

## 2015-05-23 LAB — APTT: aPTT: 26 seconds (ref 24–37)

## 2015-05-23 LAB — CBC
HEMATOCRIT: 44.3 % (ref 39.0–52.0)
HEMOGLOBIN: 14.9 g/dL (ref 13.0–17.0)
MCH: 32.3 pg (ref 26.0–34.0)
MCHC: 33.6 g/dL (ref 30.0–36.0)
MCV: 95.9 fL (ref 78.0–100.0)
Platelets: 189 10*3/uL (ref 150–400)
RBC: 4.62 MIL/uL (ref 4.22–5.81)
RDW: 14.4 % (ref 11.5–15.5)
WBC: 13.5 10*3/uL — ABNORMAL HIGH (ref 4.0–10.5)

## 2015-05-23 LAB — POCT ACTIVATED CLOTTING TIME: ACTIVATED CLOTTING TIME: 466 s

## 2015-05-23 LAB — PROTIME-INR
INR: 1.01 (ref 0.00–1.49)
Prothrombin Time: 13.5 seconds (ref 11.6–15.2)

## 2015-05-23 LAB — MRSA PCR SCREENING: MRSA by PCR: NEGATIVE

## 2015-05-23 SURGERY — LEFT HEART CATH AND CORONARY ANGIOGRAPHY
Anesthesia: LOCAL

## 2015-05-23 MED ORDER — NITROGLYCERIN IN D5W 200-5 MCG/ML-% IV SOLN
INTRAVENOUS | Status: AC
  Filled 2015-05-23: qty 250

## 2015-05-23 MED ORDER — LIDOCAINE HCL (PF) 1 % IJ SOLN
INTRAMUSCULAR | Status: AC
  Filled 2015-05-23: qty 30

## 2015-05-23 MED ORDER — PANTOPRAZOLE SODIUM 40 MG PO TBEC
40.0000 mg | DELAYED_RELEASE_TABLET | Freq: Every day | ORAL | Status: DC
  Administered 2015-05-23 – 2015-05-27 (×5): 40 mg via ORAL
  Filled 2015-05-23 (×5): qty 1

## 2015-05-23 MED ORDER — SODIUM CHLORIDE 0.9 % IV SOLN
0.2500 mg/kg/h | INTRAVENOUS | Status: DC
  Filled 2015-05-23: qty 250

## 2015-05-23 MED ORDER — MIDAZOLAM HCL 2 MG/2ML IJ SOLN
INTRAMUSCULAR | Status: DC | PRN
  Administered 2015-05-23: 1 mg via INTRAVENOUS

## 2015-05-23 MED ORDER — BIVALIRUDIN 250 MG IV SOLR
INTRAVENOUS | Status: AC
  Filled 2015-05-23: qty 250

## 2015-05-23 MED ORDER — BIVALIRUDIN BOLUS VIA INFUSION - CUPID
INTRAVENOUS | Status: DC | PRN
  Administered 2015-05-23: 79.95 mg via INTRAVENOUS

## 2015-05-23 MED ORDER — TICAGRELOR 90 MG PO TABS
90.0000 mg | ORAL_TABLET | Freq: Two times a day (BID) | ORAL | Status: DC
  Administered 2015-05-24 – 2015-05-27 (×7): 90 mg via ORAL
  Filled 2015-05-23 (×7): qty 1

## 2015-05-23 MED ORDER — IOHEXOL 350 MG/ML SOLN
INTRAVENOUS | Status: DC | PRN
  Administered 2015-05-23: 325 mL via INTRAVENOUS

## 2015-05-23 MED ORDER — ONDANSETRON HCL 4 MG/2ML IJ SOLN: 4.0000 mg | Freq: Four times a day (QID) | INTRAMUSCULAR | Status: DC | PRN

## 2015-05-23 MED ORDER — HEPARIN (PORCINE) IN NACL 2-0.9 UNIT/ML-% IJ SOLN
INTRAMUSCULAR | Status: AC
  Filled 2015-05-23: qty 1000

## 2015-05-23 MED ORDER — TICAGRELOR 90 MG PO TABS
ORAL_TABLET | ORAL | Status: DC | PRN
  Administered 2015-05-23: 180 mg via ORAL

## 2015-05-23 MED ORDER — FENTANYL CITRATE (PF) 100 MCG/2ML IJ SOLN
INTRAMUSCULAR | Status: DC | PRN
  Administered 2015-05-23 (×4): 50 ug via INTRAVENOUS

## 2015-05-23 MED ORDER — VERAPAMIL HCL 2.5 MG/ML IV SOLN
INTRAVENOUS | Status: AC
  Filled 2015-05-23: qty 2

## 2015-05-23 MED ORDER — TIROFIBAN (AGGRASTAT) BOLUS VIA INFUSION
INTRAVENOUS | Status: DC | PRN
  Administered 2015-05-23: 2665 ug via INTRAVENOUS

## 2015-05-23 MED ORDER — ATORVASTATIN CALCIUM 80 MG PO TABS
80.0000 mg | ORAL_TABLET | Freq: Every day | ORAL | Status: DC
  Administered 2015-05-24 – 2015-05-26 (×3): 80 mg via ORAL
  Filled 2015-05-23 (×4): qty 1

## 2015-05-23 MED ORDER — NITROGLYCERIN 1 MG/10 ML FOR IR/CATH LAB
INTRA_ARTERIAL | Status: DC | PRN
  Administered 2015-05-23: 100 ug via INTRACORONARY
  Administered 2015-05-23: 100 ug

## 2015-05-23 MED ORDER — NITROGLYCERIN IN D5W 200-5 MCG/ML-% IV SOLN
INTRAVENOUS | Status: DC | PRN
  Administered 2015-05-23: 20 ug/min via INTRAVENOUS

## 2015-05-23 MED ORDER — ISOSORBIDE MONONITRATE ER 30 MG PO TB24
30.0000 mg | ORAL_TABLET | Freq: Every day | ORAL | Status: DC
  Administered 2015-05-24 – 2015-05-27 (×3): 30 mg via ORAL
  Filled 2015-05-23 (×5): qty 1

## 2015-05-23 MED ORDER — TRAMADOL HCL 50 MG PO TABS
50.0000 mg | ORAL_TABLET | Freq: Four times a day (QID) | ORAL | Status: DC | PRN
  Administered 2015-05-25 – 2015-05-27 (×2): 50 mg via ORAL
  Filled 2015-05-23 (×2): qty 1

## 2015-05-23 MED ORDER — FENTANYL CITRATE (PF) 100 MCG/2ML IJ SOLN
INTRAMUSCULAR | Status: AC
  Filled 2015-05-23: qty 2

## 2015-05-23 MED ORDER — HEPARIN (PORCINE) IN NACL 2-0.9 UNIT/ML-% IJ SOLN
INTRAMUSCULAR | Status: AC
  Filled 2015-05-23: qty 500

## 2015-05-23 MED ORDER — ASPIRIN 81 MG PO CHEW
324.0000 mg | CHEWABLE_TABLET | Freq: Once | ORAL | Status: DC
  Filled 2015-05-23: qty 4

## 2015-05-23 MED ORDER — NITROGLYCERIN 1 MG/10 ML FOR IR/CATH LAB
INTRA_ARTERIAL | Status: AC
  Filled 2015-05-23: qty 10

## 2015-05-23 MED ORDER — ACETAMINOPHEN 325 MG PO TABS: 650.0000 mg | ORAL_TABLET | ORAL | Status: DC | PRN

## 2015-05-23 MED ORDER — SODIUM CHLORIDE 0.9 % IV SOLN
250.0000 mg | INTRAVENOUS | Status: DC | PRN
  Administered 2015-05-23: 1.75 mg/kg/h via INTRAVENOUS

## 2015-05-23 MED ORDER — VERAPAMIL HCL 2.5 MG/ML IV SOLN
INTRAVENOUS | Status: DC | PRN
  Administered 2015-05-23: 150 ug via INTRACORONARY
  Administered 2015-05-23 (×2): 100 ug via INTRACORONARY

## 2015-05-23 MED ORDER — HEPARIN SODIUM (PORCINE) 1000 UNIT/ML IJ SOLN
INTRAMUSCULAR | Status: AC
  Filled 2015-05-23: qty 1

## 2015-05-23 MED ORDER — ASPIRIN 81 MG PO CHEW
81.0000 mg | CHEWABLE_TABLET | Freq: Every day | ORAL | Status: DC
  Administered 2015-05-23 – 2015-05-27 (×5): 81 mg via ORAL
  Filled 2015-05-23 (×5): qty 1

## 2015-05-23 MED ORDER — OXYCODONE-ACETAMINOPHEN 5-325 MG PO TABS
1.0000 | ORAL_TABLET | ORAL | Status: DC | PRN
  Administered 2015-05-23 – 2015-05-24 (×5): 2 via ORAL
  Administered 2015-05-25: 1 via ORAL
  Administered 2015-05-25 – 2015-05-27 (×6): 2 via ORAL
  Filled 2015-05-23 (×12): qty 2

## 2015-05-23 MED ORDER — SODIUM CHLORIDE 0.9 % IJ SOLN
3.0000 mL | Freq: Two times a day (BID) | INTRAMUSCULAR | Status: DC
  Administered 2015-05-24 – 2015-05-26 (×5): 3 mL via INTRAVENOUS

## 2015-05-23 MED ORDER — SODIUM CHLORIDE 0.9 % IJ SOLN: 3.0000 mL | INTRAMUSCULAR | Status: DC | PRN

## 2015-05-23 MED ORDER — HEPARIN SODIUM (PORCINE) 5000 UNIT/ML IJ SOLN
60.0000 [IU]/kg | INTRAMUSCULAR | Status: DC
  Administered 2015-05-23: 4000 [IU] via INTRAVENOUS

## 2015-05-23 MED ORDER — LISINOPRIL 40 MG PO TABS
40.0000 mg | ORAL_TABLET | Freq: Every day | ORAL | Status: DC
  Administered 2015-05-24 – 2015-05-27 (×4): 40 mg via ORAL
  Filled 2015-05-23 (×5): qty 1

## 2015-05-23 MED ORDER — TICAGRELOR 90 MG PO TABS
ORAL_TABLET | ORAL | Status: AC
  Filled 2015-05-23: qty 2

## 2015-05-23 MED ORDER — METOPROLOL TARTRATE 25 MG PO TABS
25.0000 mg | ORAL_TABLET | Freq: Two times a day (BID) | ORAL | Status: DC
  Administered 2015-05-23 – 2015-05-27 (×7): 25 mg via ORAL
  Filled 2015-05-23 (×6): qty 2
  Filled 2015-05-23: qty 1
  Filled 2015-05-23: qty 2

## 2015-05-23 MED ORDER — TIROFIBAN HCL IV 5 MG/100ML
INTRAVENOUS | Status: AC
  Filled 2015-05-23: qty 100

## 2015-05-23 MED ORDER — SODIUM CHLORIDE 0.9 % IV SOLN
INTRAVENOUS | Status: DC
  Administered 2015-05-23: 15:00:00 via INTRAVENOUS

## 2015-05-23 MED ORDER — TIROFIBAN HCL IV 5 MG/100ML: 0.1500 ug/kg/min | INTRAVENOUS | Status: DC

## 2015-05-23 MED ORDER — MIDAZOLAM HCL 2 MG/2ML IJ SOLN
INTRAMUSCULAR | Status: AC
  Filled 2015-05-23: qty 2

## 2015-05-23 MED ORDER — TIROFIBAN HCL IV 5 MG/100ML
INTRAVENOUS | Status: DC | PRN
  Administered 2015-05-23: 0.15 ug/kg/min via INTRAVENOUS

## 2015-05-23 MED ORDER — SODIUM CHLORIDE 0.9 % IV SOLN
250.0000 mL | INTRAVENOUS | Status: DC | PRN
  Administered 2015-05-24: 22:00:00 via INTRAVENOUS

## 2015-05-23 MED ORDER — CLOPIDOGREL BISULFATE 75 MG PO TABS: 75.0000 mg | ORAL_TABLET | Freq: Every day | ORAL | Status: DC

## 2015-05-23 MED ORDER — SODIUM CHLORIDE 0.9 % WEIGHT BASED INFUSION: 1.0000 mL/kg/h | INTRAVENOUS | Status: AC

## 2015-05-23 SURGICAL SUPPLY — 22 items
BALLN EMERGE MR 3.0X15 (BALLOONS) ×2
BALLOON EMERGE MR 3.0X15 (BALLOONS) IMPLANT
CATH EXTRAC PRONTO 5.5F 138CM (CATHETERS) ×1 IMPLANT
CATH INFINITI 5 FR IM (CATHETERS) ×1 IMPLANT
CATH INFINITI 5FR MULTPACK ANG (CATHETERS) ×1 IMPLANT
CATH SITESEER 5F MULTI A 2 (CATHETERS) ×1 IMPLANT
DEVICE WIRE ANGIOSEAL 6FR (Vascular Products) ×1 IMPLANT
FEM STOP ARCH (HEMOSTASIS) ×1
GUIDE CATH RUNWAY 6FR AL 75 (CATHETERS) ×1 IMPLANT
KIT ENCORE 26 ADVANTAGE (KITS) ×1 IMPLANT
KIT HEART LEFT (KITS) ×2 IMPLANT
PACK CARDIAC CATHETERIZATION (CUSTOM PROCEDURE TRAY) ×2 IMPLANT
SHEATH PINNACLE 6F 10CM (SHEATH) ×1 IMPLANT
STENT REBEL MR 4.0X20 (Permanent Stent) ×1 IMPLANT
STENT REBEL MR 4.0X28 (Permanent Stent) ×1 IMPLANT
STENT REBEL MR 4.0X32 (Permanent Stent) ×2 IMPLANT
SYSTEM COMPRESSION FEMOSTOP (HEMOSTASIS) IMPLANT
TRANSDUCER W/STOPCOCK (MISCELLANEOUS) ×2 IMPLANT
TUBING CIL FLEX 10 FLL-RA (TUBING) ×2 IMPLANT
WIRE ASAHI FIELDER XT 190CM (WIRE) ×1 IMPLANT
WIRE ASAHI PROWATER 180CM (WIRE) ×1 IMPLANT
WIRE EMERALD 3MM-J .035X150CM (WIRE) ×1 IMPLANT

## 2015-05-23 NOTE — ED Notes (Signed)
MD Ralene Bathe at the bedside. All clothes removed and given to family.

## 2015-05-23 NOTE — ED Notes (Addendum)
Patient being transported [place]. Cath Lab pads placed on patient with Zoll Monitor.

## 2015-05-23 NOTE — Progress Notes (Addendum)
Pharmacy asked to dose Aggrastat for 24h.  Patient weighs 106kg and has a CrCl of >56m/min.  Aggrastat bolus was given in cath lab ~1730 this evening.  CBC is wnl. No overt bleeding noted.   Patient also received bivalrudin in cath.   Plan: -Aggrastat 0.177m/g/min through 1730 on 6/8 (24h total of therapy) -follow for s/s bleeding post-cath   Rhoderick Farrel D. Celestine Bougie, PharmD, BCPS Clinical Pharmacist Pager: [phone]/06/2015 6:28 PM

## 2015-05-23 NOTE — ED Notes (Addendum)
Pt arrives from home via [facility]. Pt has c/o cp that is over the entire upper chest. Pt had 3 nitro's en route without any relief of cp. Pt also had '325mg'$  of aspirin pta of EMS. Pt has no other complaints at this time. Pt had a triple bypass in 2010 after having a MI.

## 2015-05-23 NOTE — ED Notes (Signed)
Home medications returned to family by Marya Amsler.

## 2015-05-23 NOTE — ED Notes (Signed)
Cardiology at the bedside.

## 2015-05-23 NOTE — ED Notes (Signed)
Patient in Cath Lab. Card RN at the bedside.

## 2015-05-23 NOTE — ED Provider Notes (Signed)
CSN: [id number]     Arrival date & time 05/23/15  1442 History   First MD Initiated Contact with Patient 05/23/15 [phone]     Chief Complaint  Patient presents with  . Chest Pain     The history is provided by the patient. No language interpreter was used.  Clinton Baldwin presents for evaluation of chest pain. He had a small twinges of chest pain on the left chest last night. The pain resolved. Between 11 and 2 PM today he developed severe left-sided chest pain that is described as pressure and tightness sensation. He denies any fevers, coughing, abdominal pain, vomiting, diarrhea. He has some shortness of breath. He received nitroglycerin prior to arrival by EMS without any relief of his pain. He had aspirin around 6 AM this morning. He has a history of CAD and had a CABG several years ago. Symptoms are severe and worsening.   Past Medical History  Diagnosis Date  . Hypertension   . Dyslipidemia   . GERD (gastroesophageal reflux disease)   . History of pulmonary embolism     years ago  . Atrial fibrillation     post op  . Coronary artery disease     s/p STEMI 11/2009  . 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 Leukocytosis     referred to hematology  . Rheumatoid arthritis   . Carpal tunnel syndrome   . Myocardial infarction   . Anginal pain   . Dysrhythmia    Past Surgical History  Procedure Laterality Date  . Coronary artery bypass graft  11/30/2009    x5. LIMA to LAD, SVG to diagonal branch of LAD, SVG sequentially to OM2 and 3, SVG to distal RCA with EVH from the right leg   . Hemorrhoid surgery     Family History  Problem Relation Age of Onset  . Diabetes Mother   . Heart attack Brother 51  . Heart attack Brother 50   History  Substance Use Topics  . Smoking status: Former Research scientist (life sciences)  . Smokeless tobacco: Not on file  . Alcohol Use: No    Review of Systems  All other systems reviewed and are negative.     Allergies  Review of patient's  allergies indicates no known allergies.  Home Medications   Prior to Admission medications   Medication Sig Start Date End Date Taking? Authorizing Provider  aspirin 325 MG tablet Take 325 mg by mouth daily.      Historical Provider, MD  Cholecalciferol (VITAMIN D-3) 1000 UNITS CAPS Take 1 capsule by mouth daily.    Historical Provider, MD  Ferrous Sulfate (IRON) 325 (65 FE) MG TABS Take 1 tablet by mouth daily.      Historical Provider, MD  folic acid (FOLVITE) 1 MG tablet Take 1 mg by mouth daily.    Historical Provider, MD  hydrochlorothiazide (MICROZIDE) 12.5 MG capsule Take 12.5 mg by mouth daily.    Historical Provider, MD  isosorbide mononitrate (IMDUR) 30 MG 24 hr tablet Take 1 tablet (30 mg total) by mouth daily. 01/31/14   Shanker Kristeen Mans, MD  lisinopril (PRINIVIL,ZESTRIL) 40 MG tablet Take 40 mg by mouth daily.      Historical Provider, MD  methotrexate (RHEUMATREX) 2.5 MG tablet Take 15 mg by mouth once a week. Take 6 tablets on Wednesday.Caution:Chemotherapy. Protect from light.    Historical Provider, MD  metoprolol (LOPRESSOR) 50 MG tablet Take 25 mg by mouth 2 (two)  times daily.    Historical Provider, MD  omeprazole (PRILOSEC) 20 MG capsule Take 20 mg by mouth daily.    Historical Provider, MD  pravastatin (PRAVACHOL) 20 MG tablet Take 20 mg by mouth daily.    Historical Provider, MD  predniSONE (DELTASONE) 10 MG tablet Take 10 mg by mouth daily with breakfast.    Historical Provider, MD  traMADol (ULTRAM) 50 MG tablet Take by mouth 3 (three) times daily as needed for moderate pain.    Historical Provider, MD   BP 102/77 mmHg  Pulse 56  Temp(Src) 97.9 F (36.6 C)  Resp 15  SpO2 99% Physical Exam  Constitutional: He is oriented to person, place, and time. He appears well-developed and well-nourished.  HENT:  Head: Normocephalic and atraumatic.  Cardiovascular: Normal rate and regular rhythm.   No murmur heard. Pulmonary/Chest: Effort normal and breath sounds normal.  No respiratory distress.  Abdominal: Soft. There is no tenderness. There is no rebound and no guarding.  Musculoskeletal: He exhibits no tenderness.  Trace pitting edema bilateral lower extremities  Neurological: He is alert and oriented to person, place, and time.  Skin: Skin is warm and dry.  Psychiatric: He has a normal mood and affect. His behavior is normal.  Nursing note and vitals reviewed.   ED Course  Procedures  CRITICAL CARE Performed by: Quintella Reichert   Total critical care time: 20 minutes  Critical care time was exclusive of separately billable procedures and treating other patients.  Critical care was necessary to treat or prevent imminent or life-threatening deterioration.  Critical care was time spent personally by me on the following activities: development of treatment plan with patient and/or surrogate as well as nursing, discussions with consultants, evaluation of patient's response to treatment, examination of patient, obtaining history from patient or surrogate, ordering and performing treatments and interventions, ordering and review of laboratory studies, ordering and review of radiographic studies, pulse oximetry and re-evaluation of patient's condition.  Labs Review Labs Reviewed  CBC - Abnormal; Notable for the following:    WBC 13.5 (*)    All other components within normal limits  COMPREHENSIVE METABOLIC PANEL - Abnormal; Notable for the following:    Glucose, Bld 108 (*)    Creatinine, Ser 1.46 (*)    Calcium 8.7 (*)    ALT 11 (*)    GFR calc non Af Amer 49 (*)    GFR calc Af Amer 56 (*)    All other components within normal limits  POCT I-STAT, CHEM 8 - Abnormal; Notable for the following:    Creatinine, Ser 1.40 (*)    Glucose, Bld 107 (*)    All other components within normal limits  APTT  PROTIME-INR  I-STAT TROPOININ, ED    Imaging Review No results found.   EKG Interpretation None      MDM   Final diagnoses:  ST elevation  myocardial infarction (STEMI), unspecified artery   Patient here for evaluation of chest pain, has a history of coronary artery disease. Initial EKG with borderline inferior elevation with reciprocal changes. Repeat EKG concerning for STEMI, Cath lab was activated for STEMI. Patient updated of need for further evaluation and patient is in agreement with plan.    Quintella Reichert, MD 05/24/15 [phone]

## 2015-05-23 NOTE — H&P (Signed)
Patient ID: Clinton Baldwin MRN: [id number], DOB/AGE: 19-Jun-1951   Admit date: 05/23/2015   Primary Physician: Alessandra Grout, MD Primary Cardiologist: Lenna Sciara. Hochrein, MD   Pt. Profile:  64 year old male with a prior history of coronary artery disease status post coronary artery bypass grafting who presented today with acute inferior ST elevation myocardial infarction.  Problem List  Past Medical History  Diagnosis Date  . Hypertension   . Dyslipidemia   . GERD (gastroesophageal reflux disease)   . History of pulmonary embolism     years ago  . Atrial fibrillation     post op  . Coronary artery disease     a. s/p STEMI 11/2009-->s/p CABG x 5: LIMA->LAD, VG->Diag, VG->OM2->OM3, VG->dRCA;  b. 05/23/2015 Inf STEMI/PCI: LM nl, LAD 65p/m, LCX 100, OM/2/3 small, RCA 67m RPDA small, VG->dRCA 30p, VG->OM2->OM3 99 prox to OM2, 100 prox to OM3 (Rebel BMS's x 4), VG->Diag ok, LIMA->LAD ok.  . 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 Leukocytosis     referred to hematology  . Rheumatoid arthritis   . Carpal tunnel syndrome   . Chronic pain     a. uses Marijuana for severe pain.  . Morbid obesity     Past Surgical History  Procedure Laterality Date  . Coronary artery bypass graft  11/30/2009    x5. LIMA to LAD, SVG to diagonal branch of LAD, SVG sequentially to OM2 and 3, SVG to distal RCA with EVH from the right leg   . Hemorrhoid surgery       Allergies  No Known Allergies  HPI  64year old male with the above complex problem list. He is status post prior ST elevation MI and subsequent coronary artery bypass grafting 5 in 2010. He was last seen in clinic in 2013. He was in his usual state of health until approximately 1:00 this afternoon when he began to experience severe substernal chest pressure. He stayed home for about 2 hours prior to calling EMS. He was found to have inferior ST segment elevation. [facility]  where diagnostic catheterization has shown severe disease within the sequential vein graft to the OM2 and OM 3. He is now status post successful PCI and bare metal stenting with a total of 4 Rebel bare metal stents placed, covering a total of 112 mm.  he is currently chest pain-free and awaiting transfer to the ICU.  Home Medications  Prior to Admission medications   Medication Sig Start Date End Date Taking? Authorizing Provider  allopurinol (ZYLOPRIM) 100 MG tablet Take 100 mg by mouth daily.   Yes Historical Provider, MD  aspirin 325 MG tablet Take 325 mg by mouth daily.     Yes Historical Provider, MD  Calcium Carbonate-Vitamin D 600-200 MG-UNIT TABS Take 1 tablet by mouth daily.   Yes Historical Provider, MD  Cholecalciferol (VITAMIN D-3) 1000 UNITS CAPS Take 1 capsule by mouth daily.   Yes Historical Provider, MD  folic acid (FOLVITE) 1 MG tablet Take 1 mg by mouth daily.   Yes Historical Provider, MD  hydrochlorothiazide (MICROZIDE) 12.5 MG capsule Take 12.5 mg by mouth daily.   Yes Historical Provider, MD  isosorbide mononitrate (IMDUR) 30 MG 24 hr tablet Take 1 tablet (30 mg total) by mouth daily. 01/31/14  Yes Shanker MKristeen Mans MD  lisinopril (PRINIVIL,ZESTRIL) 40 MG tablet Take 40 mg by mouth daily.     Yes Historical  Provider, MD  metoprolol (LOPRESSOR) 50 MG tablet Take 25 mg by mouth 2 (two) times daily.   Yes Historical Provider, MD  omeprazole (PRILOSEC) 20 MG capsule Take 20 mg by mouth daily.   Yes Historical Provider, MD  pravastatin (PRAVACHOL) 20 MG tablet Take 20 mg by mouth daily.   Yes Historical Provider, MD  traMADol (ULTRAM) 50 MG tablet Take by mouth 3 (three) times daily as needed for moderate pain.   Yes Historical Provider, MD    Family History  Family History  Problem Relation Age of Onset  . Diabetes Mother   . Heart attack Brother 58  . Heart attack Brother 83    Social History  History   Social History  . Marital Status: Married    Spouse Name:  N/A  . Number of Children: N/A  . Years of Education: N/A   Occupational History  . Not on file.   Social History Main Topics  . Smoking status: Former Research scientist (life sciences)  . Smokeless tobacco: Not on file  . Alcohol Use: No  . Drug Use: Yes    Special: Marijuana     Comment: occasionally uses marijuana  . Sexual Activity: Not on file   Other Topics Concern  . Not on file   Social History Narrative   Lives in [place] with family. Does not routinely exercise.     Review of Systems General:  No chills, fever, night sweats or weight changes.  Cardiovascular:  Positive chest pain and dyspnea on exertion. No edema, orthopnea, palpitations, paroxysmal nocturnal dyspnea. Dermatological: No rash, lesions/masses Respiratory: No cough, dyspnea Urologic: No hematuria, dysuria Abdominal:   No nausea, vomiting, diarrhea, bright red blood per rectum, melena, or hematemesis Neurologic:  No visual changes, wkns, changes in mental status. All other systems reviewed and are otherwise negative except as noted above.  Physical Exam  Blood pressure 132/82, pulse 57, temperature 97.9 F (36.6 C), resp. rate 11, height '5\' 7"'$  (1.702 m), weight 235 lb (106.595 kg), SpO2 93 %.  General: Pleasant, NAD Psych: Normal affect. Neuro: Alert and oriented X 3. Moves all extremities spontaneously. HEENT: Normal  Neck: Supple without bruits or JVD. Lungs:  Resp regular and unlabored, CTA. Heart: RRR no s3, s4, or murmurs. Abdomen: Soft, non-tender, non-distended, BS + x 4.  Extremities: No clubbing, cyanosis or edema. DP/PT/Radials 2+ and equal bilaterally.  Labs  Troponin [facility] Test)  Recent Labs  05/23/15 1516  TROPIPOC 0.02    Lab Results  Component Value Date   WBC 13.5* 05/23/2015   HGB 15.0 05/23/2015   HCT 44.0 05/23/2015   MCV 95.9 05/23/2015   PLT 189 05/23/2015    Recent Labs Lab 05/23/15 1513 05/23/15 1557  NA 138 139  K 3.9 3.6  CL 104 102  CO2 24  --   BUN 17 18  CREATININE  1.46* 1.40*  CALCIUM 8.7*  --   PROT 6.6  --   BILITOT 0.6  --   ALKPHOS 38  --   ALT 11*  --   AST 17  --   GLUCOSE 108* 107*     Radiology/Studies  No results found.  ECG  Sinus bradycardia, 56, inferior ST segment elevation, anterolateral T-wave inversion.  ASSESSMENT AND PLAN  1. Acute inferior ST segment elevation myocardial infarction/coronary artery disease: Status post successful extensive bare-metal stenting of the vein graft to the second and third obtuse marginals. He tolerated procedure well. Plan to admit to the coronary intensive care  unit. Continue aspirin, Plavix, beta blocker, ACE inhibitor, and high potency statin. Plan for cardiac rehabilitation to see tomorrow.  2. Essential hypertension: Blood pressure was elevated during case and he did require IV nitroglycerin. Follow-up beta blocker and ACE inhibitor.  3. Hyperlipidemia: He is on pravastatin at home. In the setting of an ACS, we'll increase the Lipitor 80.  4. Mild renal insufficiency: Follow creatinine post contrast. He will receive hydration tonight.  Signed, Murray Hodgkins, NP 05/23/2015, 7:12 PM

## 2015-05-24 ENCOUNTER — Encounter (HOSPITAL_COMMUNITY): Payer: Self-pay | Admitting: Interventional Cardiology

## 2015-05-24 DIAGNOSIS — I2119 ST elevation (STEMI) myocardial infarction involving other coronary artery of inferior wall: Secondary | ICD-10-CM

## 2015-05-24 LAB — BASIC METABOLIC PANEL
ANION GAP: 10 (ref 5–15)
Anion gap: 10 (ref 5–15)
BUN: 15 mg/dL (ref 6–20)
BUN: 15 mg/dL (ref 6–20)
CHLORIDE: 100 mmol/L — AB (ref 101–111)
CO2: 27 mmol/L (ref 22–32)
CO2: 26 mmol/L (ref 22–32)
CREATININE: 1.5 mg/dL — AB (ref 0.61–1.24)
CREATININE: 1.5 mg/dL — AB (ref 0.61–1.24)
Calcium: 8.5 mg/dL — ABNORMAL LOW (ref 8.9–10.3)
Calcium: 8.2 mg/dL — ABNORMAL LOW (ref 8.9–10.3)
Chloride: 103 mmol/L (ref 101–111)
GFR calc Af Amer: 55 mL/min — ABNORMAL LOW (ref >60)
GFR calc Af Amer: 55 mL/min — ABNORMAL LOW (ref >60)
GFR calc non Af Amer: 47 mL/min — ABNORMAL LOW (ref >60)
GFR calc non Af Amer: 47 mL/min — ABNORMAL LOW (ref >60)
Glucose, Bld: 92 mg/dL (ref 65–99)
Glucose, Bld: 109 mg/dL — ABNORMAL HIGH (ref 65–99)
POTASSIUM: 4.1 mmol/L (ref 3.5–5.1)
Potassium: 3.7 mmol/L (ref 3.5–5.1)
Sodium: 137 mmol/L (ref 135–145)
Sodium: 139 mmol/L (ref 135–145)

## 2015-05-24 LAB — HEPATIC FUNCTION PANEL
ALBUMIN: 3.3 g/dL — AB (ref 3.5–5.0)
ALT: 18 U/L (ref 17–63)
AST: 62 U/L — ABNORMAL HIGH (ref 15–41)
Alkaline Phosphatase: 37 U/L — ABNORMAL LOW (ref 38–126)
BILIRUBIN TOTAL: 0.9 mg/dL (ref 0.3–1.2)
Bilirubin, Direct: 0.1 mg/dL (ref 0.1–0.5)
Indirect Bilirubin: 0.8 mg/dL (ref 0.3–0.9)
TOTAL PROTEIN: 6.1 g/dL — AB (ref 6.5–8.1)

## 2015-05-24 LAB — CBC
HCT: 41.2 % (ref 39.0–52.0)
Hemoglobin: 13.8 g/dL (ref 13.0–17.0)
MCH: 32.2 pg (ref 26.0–34.0)
MCHC: 33.5 g/dL (ref 30.0–36.0)
MCV: 96 fL (ref 78.0–100.0)
PLATELETS: 190 10*3/uL (ref 150–400)
RBC: 4.29 MIL/uL (ref 4.22–5.81)
RDW: 14.5 % (ref 11.5–15.5)
WBC: 13.5 10*3/uL — ABNORMAL HIGH (ref 4.0–10.5)

## 2015-05-24 LAB — LIPID PANEL
CHOL/HDL RATIO: 3.2 ratio
CHOLESTEROL: 131 mg/dL (ref 0–200)
HDL: 41 mg/dL (ref >40)
LDL CALC: 68 mg/dL (ref 0–99)
Triglycerides: 108 mg/dL (ref <150)
VLDL: 22 mg/dL (ref 0–40)

## 2015-05-24 LAB — TROPONIN I: TROPONIN I: 5.47 ng/mL — AB (ref <0.031)

## 2015-05-24 LAB — HEMOGLOBIN AND HEMATOCRIT, BLOOD
HEMATOCRIT: 40.8 % (ref 39.0–52.0)
HEMOGLOBIN: 13.4 g/dL (ref 13.0–17.0)

## 2015-05-24 MED ORDER — ALUM & MAG HYDROXIDE-SIMETH 200-200-20 MG/5ML PO SUSP
30.0000 mL | ORAL | Status: DC | PRN
  Administered 2015-05-24: 30 mL via ORAL
  Filled 2015-05-24: qty 30

## 2015-05-24 MED ORDER — MORPHINE SULFATE 2 MG/ML IJ SOLN
2.0000 mg | INTRAMUSCULAR | Status: DC | PRN
Start: 2015-05-24 — End: 2015-05-27
  Administered 2015-05-24 – 2015-05-26 (×6): 2 mg via INTRAVENOUS
  Filled 2015-05-24 (×6): qty 1

## 2015-05-24 MED ORDER — NITROGLYCERIN IN D5W 200-5 MCG/ML-% IV SOLN
0.0000 ug/min | INTRAVENOUS | Status: DC
  Administered 2015-05-24: 33.333 ug/min via INTRAVENOUS
  Administered 2015-05-25: 40 ug/min via INTRAVENOUS
  Filled 2015-05-24: qty 250

## 2015-05-24 NOTE — Progress Notes (Signed)
Patient having chest tightness. BP elevated, HR in the 60's. EKG performed, no new changes. Kerin Ransom, PA-C made aware. New orders given. Will continue to monitor.

## 2015-05-24 NOTE — Progress Notes (Signed)
Received report from Clinton Baldwin, patient still c/o of chest pain that radiates from right to left . No pain present in left arm. No SOB present. Morphine 2 mg IV given with 30 mg Mylanta. Reassessed pain -reports that it had "lighten up" yet was still present. Reviewed 12 lead EKG obtained by Towana Badger. No changes observed.  Paged Dr. Clayborne Artist at 2022 - updated him on patient's current assessment. IV NTG infusion resumed at 10 mcg . Additional orders receive for  troponin every four hours x 4.

## 2015-05-24 NOTE — Progress Notes (Signed)
Patient ID: Clinton Baldwin, male   DOB: 08-19-1951, 64 y.o.   MRN: [id number]    Subjective:  Right groin still sore  Objective:  Filed Vitals:   05/24/15 0500 05/24/15 0600 05/24/15 0700 05/24/15 0730  BP: 138/83 128/55 129/64 122/69  Pulse: 63     Temp:    97.5 F (36.4 C)  TempSrc:    Oral  Resp: '14 11 9   '$ Height:      Weight:      SpO2: 96%   99%    Intake/Output from previous day:  Intake/Output Summary (Last 24 hours) at 05/24/15 0739 Last data filed at 05/24/15 0710  Gross per 24 hour  Intake    992 ml  Output   1300 ml  Net   -308 ml    Physical Exam: Affect appropriate Obese chronically ill male  HEENT: normal Neck supple with no adenopathy JVP normal no bruits no thyromegaly Lungs clear with no wheezing and good diaphragmatic motion Heart:  S1/S2 no murmur, no rub, gallop or click PMI normal Abdomen: benighn, BS positve, no tenderness, no AAA   Large echymosis / hematoma right groin still tender no bruit  no bruit.  No HSM or HJR Distal pulses intact with no bruits No edema Neuro non-focal Skin warm and dry No muscular weakness   Lab Results: Basic Metabolic Panel:  Recent Labs  05/23/15 1513 05/23/15 1557 05/24/15 0226  NA 138 139 137  K 3.9 3.6 4.1  CL 104 102 100*  CO2 24  --  27  GLUCOSE 108* 107* 92  BUN '17 18 15  '$ CREATININE 1.46* 1.40* 1.50*  CALCIUM 8.7*  --  8.5*   Liver Function Tests:  Recent Labs  05/23/15 1513 05/24/15 0226  AST 17 62*  ALT 11* 18  ALKPHOS 38 37*  BILITOT 0.6 0.9  PROT 6.6 6.1*  ALBUMIN 3.6 3.3*   No results for input(s): LIPASE, AMYLASE in the last 72 hours. CBC:  Recent Labs  05/23/15 1513 05/23/15 1557 05/24/15 0226  WBC 13.5*  --  13.5*  HGB 14.9 15.0 13.8  HCT 44.3 44.0 41.2  MCV 95.9  --  96.0  PLT 189  --  190     Recent Labs  05/24/15 0226  CHOL 131  HDL 41  LDLCALC 68  TRIG 108  CHOLHDL 3.2    Imaging: No results found.  Cardiac Studies:  ECG:  SR inferior  infarct persistent ST depression V1-3   Telemetry:  NSR no VT  05/24/2015   Echo:   Medications:   . aspirin  81 mg Oral Daily  . atorvastatin  80 mg Oral q1800  . isosorbide mononitrate  30 mg Oral Daily  . lisinopril  40 mg Oral Daily  . metoprolol  25 mg Oral BID  . pantoprazole  40 mg Oral Daily  . sodium chloride  3 mL Intravenous Q12H  . ticagrelor  90 mg Oral BID       Assessment/Plan:  MI:  Previous CABG  Reviewed angio.  Thrombotic occlusion of SVG to OM.  Multiple stents to body of graft but distal embolization with persistant Filling defect in proximal portion of small OM.  Procedure complicated by failed angioseal and large hematoma  Hct/Cr stable  Anticoagulation stopped Except for ticagrelor.  Will let him get from bed to chair in afternoon Consider [place] in am Per Dr Tamala Julian would not try to salvage or reintervene on vessel Given small distal runoff.  HTN:  Well controlled continue ACE  Chol:  On statin   Jenkins Rouge 05/24/2015, 7:39 AM

## 2015-05-24 NOTE — Care Management Note (Signed)
Case Management Note  Patient Details  Name: Clinton Baldwin MRN: [id number] Date of Birth: Dec 24, 1950  Subjective/Objective:     Adm w mi               Action/Plan: lives w fam, pcp dr Manuela Schwartz [facility] and vet adm   Expected Discharge Date:                  Expected Discharge Plan:  Home/Self Care  In-House Referral:     Discharge planning Services  CM Consult, Medication Assistance  Post Acute Care Choice:    Choice offered to:     DME Arranged:    DME Agency:     HH Arranged:    HH Agency:     Status of Service:     Medicare Important Message Given:    Date Medicare IM Given:    Medicare IM give by:    Date Additional Medicare IM Given:    Additional Medicare Important Message give by:     If discussed at [place] of Stay Meetings, dates discussed:    Additional Comments: ur review done, gave pt 30day free brilinta card.  Lacretia Leigh, RN 05/24/2015, 11:29 AM

## 2015-05-24 NOTE — Clinical Social Work Note (Signed)
CSW Consult Acknowledged:   CSW received a consult for home health needs. CSW will updated the case manager. CSW will sign off.       [place], MSW, [place]

## 2015-05-24 NOTE — Progress Notes (Signed)
Reassessed chest pain- patient reports that it has reduced to a 1 out of 10. IV NTG at 10 mcg presently. Patient reports only c/o of low pack pain and occasional pain at cath site in right groin. Rates back pain 6/10 and right groin 3/10. Repositioned patient to left side and moved up in bed . Discussed with patient about plan of care for tonight and when he would like to receive a bath. Patient deferred bath until in the morning.

## 2015-05-25 ENCOUNTER — Inpatient Hospital Stay (HOSPITAL_COMMUNITY): Payer: Medicare Other

## 2015-05-25 DIAGNOSIS — M79604 Pain in right leg: Secondary | ICD-10-CM

## 2015-05-25 LAB — TROPONIN I
TROPONIN I: 4.2 ng/mL — AB (ref <0.031)
Troponin I: 4.71 ng/mL (ref <0.031)

## 2015-05-25 NOTE — Progress Notes (Signed)
*  PRELIMINARY RESULTS* Vascular Ultrasound Right groin arterial evaluation has been completed.  Preliminary findings: No evidence of pseudoaneurysm.   Landry Mellow, RDMS, RVT  05/25/2015, 1:55 PM

## 2015-05-25 NOTE — Progress Notes (Signed)
Patient ID: Clinton Baldwin, male   DOB: 01/09/1951, 64 y.o.   MRN: [id number]    Subjective:  Right groin better  SSCP last night Troponins coming down and no acute ECG changes   Objective:  Filed Vitals:   05/25/15 0500 05/25/15 0600 05/25/15 0725 05/25/15 0748  BP: 138/86   130/56  Pulse: 65 67    Temp:   97.8 F (36.6 C)   TempSrc:   Oral   Resp: 22 13    Height:      Weight:      SpO2: 94%   96%    Intake/Output from previous day:  Intake/Output Summary (Last 24 hours) at 05/25/15 0830 Last data filed at 05/25/15 0800  Gross per 24 hour  Intake 1056.5 ml  Output   1150 ml  Net  -93.5 ml    Physical Exam: Affect appropriate Obese chronically ill male  HEENT: normal Neck supple with no adenopathy JVP normal no bruits no thyromegaly Lungs clear with no wheezing and good diaphragmatic motion Heart:  S1/S2 no murmur, no rub, gallop or click PMI normal Abdomen: benighn, BS positve, no tenderness, no AAA   Large echymosis / hematoma right groin still tender no bruit  no bruit.  No HSM or HJR Distal pulses intact with no bruits No edema Neuro non-focal Skin warm and dry No muscular weakness   Lab Results: Basic Metabolic Panel:  Recent Labs  05/24/15 0226 05/24/15 1056  NA 137 139  K 4.1 3.7  CL 100* 103  CO2 27 26  GLUCOSE 92 109*  BUN 15 15  CREATININE 1.50* 1.50*  CALCIUM 8.5* 8.2*   Liver Function Tests:  Recent Labs  05/23/15 1513 05/24/15 0226  AST 17 62*  ALT 11* 18  ALKPHOS 38 37*  BILITOT 0.6 0.9  PROT 6.6 6.1*  ALBUMIN 3.6 3.3*   CBC:  Recent Labs  05/23/15 1513  05/24/15 0226 05/24/15 1056  WBC 13.5*  --  13.5*  --   HGB 14.9  < > 13.8 13.4  HCT 44.3  < > 41.2 40.8  MCV 95.9  --  96.0  --   PLT 189  --  190  --   < > = values in this interval not displayed.   Recent Labs  05/24/15 0226  CHOL 131  HDL 41  LDLCALC 68  TRIG 108  CHOLHDL 3.2    Imaging: No results found.  Cardiac Studies:  ECG:  SR inferior  infarct persistent ST depression V1-3   Telemetry:  NSR no VT  05/25/2015   Echo:   Medications:   . aspirin  81 mg Oral Daily  . atorvastatin  80 mg Oral q1800  . isosorbide mononitrate  30 mg Oral Daily  . lisinopril  40 mg Oral Daily  . metoprolol  25 mg Oral BID  . pantoprazole  40 mg Oral Daily  . sodium chloride  3 mL Intravenous Q12H  . ticagrelor  90 mg Oral BID     . nitroGLYCERIN 25 mcg/min (05/25/15 0700)    Assessment/Plan:  MI:  Previous CABG  Reviewed angio.  Thrombotic occlusion of SVG to OM.  Multiple stents to body of graft but distal embolization with persistant Filling defect in proximal portion of small OM.  Procedure complicated by failed angioseal and large hematoma  Hct/Cr stable  Anticoagulation stopped Except for ticagrelor and asa.  [place] right groin today Hct stable  Expected some angina given distal embolization and persistent  clot in proximal OM after Graft insertion> Reassuring that toponins coming down and ECG no acute changes    HTN:  Well controlled continue ACE  Chol:  On statin   Jenkins Rouge 05/25/2015, 8:30 AM

## 2015-05-25 NOTE — Progress Notes (Signed)
Graham to see pt for ambulation. Was waiting for results of [place].  Pt sleeping now. Received morphine earlier. RN stated she walked with him short distance earlier. Also stated that pt's wife wanted to be here for ed.  Will follow up tomorrow and walk and educate pt when wife present. Will let him sleep now. Graylon Good RN BSN 05/25/2015 2:20 PM

## 2015-05-26 NOTE — Progress Notes (Signed)
CARDIAC REHAB PHASE I   PRE:  Rate/Rhythm: 73 SR  BP:  Supine:   Sitting: 114/83  Standing:    SaO2: 98%RA  MODE:  Ambulation: 700 ft   POST:  Rate/Rhythm: 88 SR  BP:  Supine:   Sitting: 132/63  Standing:    SaO2: 98%RA 1016-1122 Pt walked 700 ft with asst x 1 with steady gait. Denied CP. To recliner after walk. MI education completed with pt as he stated his wife would not be here in time for education today. Left ex ed and diet for wife to read. Pt able to answer teach back questions. Declined CRP 2 at this time as he does not have transportation. Left brochure in case he is able to get transportation in future. Has brilinta card and stressed importance with stents.   Graylon Good, RN BSN  05/26/2015 11:19 AM

## 2015-05-26 NOTE — Progress Notes (Signed)
Patient ID: Clinton Baldwin, male   DOB: 1951-08-16, 64 y.o.   MRN: [id number]    Subjective:  No chest pain  Arthritic left knee pain percocet helps  Objective:  Filed Vitals:   05/26/15 0500 05/26/15 0600 05/26/15 0700 05/26/15 0928  BP: 133/69 119/62 138/64 143/75  Pulse: 72 73 80 77  Temp:      TempSrc:      Resp: 17 37 12   Height:      Weight:      SpO2: 96% 94% 94%     Intake/Output from previous day:  Intake/Output Summary (Last 24 hours) at 05/26/15 1020 Last data filed at 05/26/15 0700  Gross per 24 hour  Intake    338 ml  Output      0 ml  Net    338 ml    Physical Exam: Affect appropriate Obese chronically ill male  HEENT: normal Neck supple with no adenopathy JVP normal no bruits no thyromegaly Lungs clear with no wheezing and good diaphragmatic motion Heart:  S1/S2 no murmur, no rub, gallop or click PMI normal Abdomen: benighn, BS positve, no tenderness, no AAA   Large echymosis / hematoma right groin still tender no bruit  no bruit.  No HSM or HJR Distal pulses intact with no bruits No edema Neuro non-focal Skin warm and dry No muscular weakness   Lab Results: Basic Metabolic Panel:  Recent Labs  05/24/15 0226 05/24/15 1056  NA 137 139  K 4.1 3.7  CL 100* 103  CO2 27 26  GLUCOSE 92 109*  BUN 15 15  CREATININE 1.50* 1.50*  CALCIUM 8.5* 8.2*   Liver Function Tests:  Recent Labs  05/23/15 1513 05/24/15 0226  AST 17 62*  ALT 11* 18  ALKPHOS 38 37*  BILITOT 0.6 0.9  PROT 6.6 6.1*  ALBUMIN 3.6 3.3*   CBC:  Recent Labs  05/23/15 1513  05/24/15 0226 05/24/15 1056  WBC 13.5*  --  13.5*  --   HGB 14.9  < > 13.8 13.4  HCT 44.3  < > 41.2 40.8  MCV 95.9  --  96.0  --   PLT 189  --  190  --   < > = values in this interval not displayed.   Recent Labs  05/24/15 0226  CHOL 131  HDL 41  LDLCALC 68  TRIG 108  CHOLHDL 3.2    Imaging: No results found.  Cardiac Studies:  ECG:  SR inferior infarct persistent ST  depression V1-3   Telemetry:  NSR no VT  05/26/2015   Echo:   Medications:   . aspirin  81 mg Oral Daily  . atorvastatin  80 mg Oral q1800  . isosorbide mononitrate  30 mg Oral Daily  . lisinopril  40 mg Oral Daily  . metoprolol  25 mg Oral BID  . pantoprazole  40 mg Oral Daily  . sodium chloride  3 mL Intravenous Q12H  . ticagrelor  90 mg Oral BID     . nitroGLYCERIN 45 mcg/min (05/26/15 0100)    Assessment/Plan:  MI:  Previous CABG  Reviewed angio.  Thrombotic occlusion of SVG to OM.  Multiple stents to body of graft but distal embolization with persistant Filling defect in proximal portion of small OM.  Procedure complicated by failed angioseal and large hematoma  Hct/Cr stable  Anticoagulation stopped Except for ticagrelor and asa.  [place] right groin negative for pseudoaneurysm   Hct stable     HTN:  Well controlled continue ACE  Chol:  On statin   Transfer to floor hopefully d/c over weekend  Jenkins Rouge 05/26/2015, 10:20 AM

## 2015-05-27 DIAGNOSIS — M790 Rheumatism, unspecified: Secondary | ICD-10-CM | POA: Diagnosis present

## 2015-05-27 DIAGNOSIS — Z951 Presence of aortocoronary bypass graft: Secondary | ICD-10-CM

## 2015-05-27 MED ORDER — ASPIRIN 81 MG PO CHEW: 81.0000 mg | CHEWABLE_TABLET | Freq: Every day | ORAL | Status: AC

## 2015-05-27 MED ORDER — ATORVASTATIN CALCIUM 80 MG PO TABS: 80.0000 mg | ORAL_TABLET | Freq: Every day | ORAL | Status: DC

## 2015-05-27 MED ORDER — PANTOPRAZOLE SODIUM 40 MG PO TBEC: 40.0000 mg | DELAYED_RELEASE_TABLET | Freq: Every day | ORAL | Status: DC

## 2015-05-27 MED ORDER — ACETAMINOPHEN 325 MG PO TABS: 650.0000 mg | ORAL_TABLET | ORAL | Status: AC | PRN

## 2015-05-27 MED ORDER — NITROGLYCERIN 0.4 MG SL SUBL: 0.4000 mg | SUBLINGUAL_TABLET | SUBLINGUAL | Status: DC | PRN

## 2015-05-27 MED ORDER — TICAGRELOR 90 MG PO TABS: 90.0000 mg | ORAL_TABLET | Freq: Two times a day (BID) | ORAL | Status: DC

## 2015-05-27 NOTE — Discharge Summary (Signed)
Patient ID: Clinton Baldwin,  MRN: [id number], DOB/AGE: 66-Jan-1952 66 y.o.  Admit date: 05/23/2015 Discharge date: 05/27/2015  Primary Care Provider: Alessandra Grout, MD Primary Cardiologist: Dr Percival Spanish  Discharge Diagnoses Principal Problem:   STEMI inferior wall 05/23/15 Active Problems:   CAD S/P BMS to SVG-OM2-OM3 05/23/15   Hx of CABG x 5 2010   Essential hypertension, benign   Hyperlipidemia   Rheumatic disease   Atrial fibrillation    Procedures: Urgent cath/ PCI 05/23/15   Hospital Course:  64 year old male with the above complex problem list. He is status post prior ST elevation MI and subsequent coronary artery bypass grafting 5 in 2010. In addition to CAD he has rheumatoid arthritis and chronic pain. He was last seen in clinic in 2013. He was in his usual state of health until approximately 1:00 pm 05/23/15 when he began to experience severe substernal chest pressure. He stayed home for about 2 hours prior to calling EMS. He was found to have inferior ST segment elevation. He was [facility] emergently to [facility] where diagnostic catheterization has shown severe disease within the sequential vein graft to the OM2 and OM 3. He underwent successful PCI and bare metal stenting with a total of 4 Rebel bare metal stents placed, covering a total of 112 mm.He tolerated this well. He was transferred to telelmetry and ambulated. We feel he can be discharged 05/27/15. He will need a TOC office visit in 7-10 days. His LDL was 68 on Pravachol 20 mg and he has chronic arthritic pain so he was not sent home on high dose statin Rx which he most likely would not tolerate.   Discharge Vitals:  Blood pressure 161/82, pulse 74, temperature 97.7 F (36.5 C), temperature source Oral, resp. rate 20, height '5\' 7"'$  (1.702 m), weight 236 lb 1.6 oz (107.094 kg), SpO2 98 %.    Labs: No results found for this or any previous visit (from the past 24 hour(s)).  Disposition:      Follow-up  Information    Follow up with Minus Breeding, MD.   Specialty:  Cardiology   Why:  offcie will contact you   Contact information:   [address] [place] [address] [phone]       Discharge Medications:    Medication List    STOP taking these medications        aspirin 325 MG tablet  Replaced by:  aspirin 81 MG chewable tablet     omeprazole 20 MG capsule  Commonly known as:  PRILOSEC  Replaced by:  pantoprazole 40 MG tablet      TAKE these medications        acetaminophen 325 MG tablet  Commonly known as:  TYLENOL  Take 2 tablets (650 mg total) by mouth every 4 (four) hours as needed for headache or mild pain.     allopurinol 100 MG tablet  Commonly known as:  ZYLOPRIM  Take 100 mg by mouth daily.     aspirin 81 MG chewable tablet  Chew 1 tablet (81 mg total) by mouth daily.     Calcium Carbonate-Vitamin D 600-200 MG-UNIT Tabs  Take 1 tablet by mouth daily.     folic acid 1 MG tablet  Commonly known as:  FOLVITE  Take 1 mg by mouth daily.     hydrochlorothiazide 12.5 MG capsule  Commonly known as:  MICROZIDE  Take 12.5 mg by mouth daily.  isosorbide mononitrate 30 MG 24 hr tablet  Commonly known as:  IMDUR  Take 1 tablet (30 mg total) by mouth daily.     lisinopril 40 MG tablet  Commonly known as:  PRINIVIL,ZESTRIL  Take 40 mg by mouth daily.     metoprolol 50 MG tablet  Commonly known as:  LOPRESSOR  Take 25 mg by mouth 2 (two) times daily.     nitroGLYCERIN 0.4 MG SL tablet  Commonly known as:  NITROSTAT  Place 1 tablet (0.4 mg total) under the tongue every 5 (five) minutes as needed for chest pain.     pantoprazole 40 MG tablet  Commonly known as:  PROTONIX  Take 1 tablet (40 mg total) by mouth daily.     pravastatin 20 MG tablet  Commonly known as:  PRAVACHOL  Take 20 mg by mouth daily.     ticagrelor 90 MG Tabs tablet  Commonly known as:  BRILINTA  Take 1 tablet (90 mg total) by mouth 2 (two) times daily.      traMADol 50 MG tablet  Commonly known as:  ULTRAM  Take by mouth 3 (three) times daily as needed for moderate pain.     Vitamin D-3 1000 UNITS Caps  Take 1 capsule by mouth daily.         Duration of Discharge Encounter: Greater than 30 minutes including physician time.  Angelena Form PA-C 05/27/2015 11:50 AM

## 2015-05-27 NOTE — Progress Notes (Signed)
Patient ID: Clinton Baldwin, male   DOB: 18-Oct-1951, 64 y.o.   MRN: [id number]    Subjective:  No chest pain  Had BM ambulating  Ready for d/c   Objective:  Filed Vitals:   05/26/15 2229 05/26/15 2300 05/27/15 0500 05/27/15 1021  BP: 155/70   161/82  Pulse: 67 61 74   Temp: 98 F (36.7 C)  97.7 F (36.5 C)   TempSrc: Oral  Oral   Resp: '17 14 20   '$ Height: '5\' 7"'$  (1.702 m)     Weight: 107.094 kg (236 lb 1.6 oz)     SpO2: 97%  98%     Intake/Output from previous day:  Intake/Output Summary (Last 24 hours) at 05/27/15 1047 Last data filed at 05/26/15 1800  Gross per 24 hour  Intake  363.5 ml  Output    300 ml  Net   63.5 ml    Physical Exam: Affect appropriate Obese chronically ill male  HEENT: normal Neck supple with no adenopathy JVP normal no bruits no thyromegaly Lungs clear with no wheezing and good diaphragmatic motion Heart:  S1/S2 no murmur, no rub, gallop or click PMI normal Abdomen: benighn, BS positve, no tenderness, no AAA   Large echymosis / hematoma right groin still tender no bruit  no bruit.  No HSM or HJR Distal pulses intact with no bruits No edema Neuro non-focal Skin warm and dry No muscular weakness   Lab Results: Basic Metabolic Panel:  Recent Labs  05/24/15 1056  NA 139  K 3.7  CL 103  CO2 26  GLUCOSE 109*  BUN 15  CREATININE 1.50*  CALCIUM 8.2*   Liver Function Tests: No results for input(s): AST, ALT, ALKPHOS, BILITOT, PROT, ALBUMIN in the last 72 hours. CBC:  Recent Labs  05/24/15 1056  HGB 13.4  HCT 40.8    No results for input(s): CHOL, HDL, LDLCALC, TRIG, CHOLHDL, LDLDIRECT in the last 72 hours.  Imaging: No results found.  Cardiac Studies:  ECG:  SR inferior infarct persistent ST depression V1-3   Telemetry:  NSR no VT  05/27/2015   Echo:   Medications:   . aspirin  81 mg Oral Daily  . atorvastatin  80 mg Oral q1800  . isosorbide mononitrate  30 mg Oral Daily  . lisinopril  40 mg Oral Daily  .  metoprolol  25 mg Oral BID  . pantoprazole  40 mg Oral Daily  . sodium chloride  3 mL Intravenous Q12H  . ticagrelor  90 mg Oral BID     . nitroGLYCERIN Stopped (05/26/15 1530)    Assessment/Plan:  MI:  Previous CABG  Reviewed angio.  Thrombotic occlusion of SVG to OM.  Multiple stents to body of graft but distal embolization with persistant Filling defect in proximal portion of small OM.  Procedure complicated by failed angioseal and large hematoma  Hct/Cr stable  Anticoagulation stopped Except for ticagrelor and asa.  [place] right groin negative for pseudoaneurysm   Hct stable     Needs two scripts for ticagrelor one 30 day for free supply and one recurrent 30 day for [facility]  HTN:  Well controlled continue ACE  Chol:  On statin   DC home today   Jenkins Rouge 05/27/2015, 10:47 AM

## 2015-05-27 NOTE — Discharge Instructions (Signed)
Myocardial Infarction °A myocardial infarction (MI) is damage to the heart that is not reversible. It is also called a heart attack. An MI usually occurs when a heart (coronary) artery becomes blocked or narrowed. This cuts off the blood supply to the heart. When one or more of the heart (coronary) arteries becomes blocked, that area of the heart begins to die. This causes pain felt during an MI.  °If you think you might be having an MI, call your local emergency services immediately (911 in [place] It is recommended that you chew and swallow 3 non-enteric coated baby aspirin if you do not have an aspirin allergy. Do not drive yourself to the hospital or wait to see if your symptoms go away. The sooner MI is treated, the greater the amount of heart muscle saved. Time is muscle. It can save your life. °CAUSES  °An MI can occur from: °· A gradual buildup of a fatty substance called plaque. When plaque builds up in the arteries, this condition is called atherosclerosis. This buildup can block or reduce the blood supply to the heart artery(s). °· A sudden plaque rupture within a heart artery that causes a blood clot (thrombus). A blood clot can block the heart artery which does not allow blood flow to the heart. °· A severe tightening (spasm) of the heart artery. This is a less common cause of a heart attack. When a heart artery spasms, it cuts off blood flow through the artery. Spasms can occur in heart arteries that do not have atherosclerosis. °RISK FACTORS °People at risk for an MI usually have one or more risk factors, such as: °· High blood pressure. °· High cholesterol. °· Smoking. °· Gender. Men have a higher heart attack risk. °· Overweight/obesity. °· Age. °· Family history. °· Lack of exercise. °· Diabetes. °· Stress. °· Excessive alcohol use. °· Street drug use (cocaine and methamphetamines). °SYMPTOMS  °MI symptoms can vary, such as: °· In both men and women, MI symptoms can include the following: °· Chest  pain. The chest pain may feel like a crushing, squeezing, or "pressure" type feeling. MI pain can be "referred," meaning pain can be caused in one part of the body but felt in another part of the body. Referred MI pain may occur in the left arm, neck, or jaw. Pain may even be felt in the right arm. °· Shortness of breath (dyspnea). °· Heartburn or indigestion with or without vomiting, shortness of breath, or sweating (diaphoresis). °· Sudden, cold sweats. °· Sudden lightheadedness. °· Upper back pain. °· Women can have unique MI symptoms, such as: °· Unexplained feelings of nervousness or anxiety. °· Discomfort between the shoulder blades (scapula) or upper back. °· Tingling in the hands and arms. °· In elderly people (regardless of gender), MI symptoms can be subtle, such as: °· Sweating (diaphoresis). °· Shortness of breath (dyspnea). °· General tiredness (fatigue) or not feeling well (malaise). °DIAGNOSIS  °Diagnosis of an MI involves several tests such as: °· An assessment of your vital signs such as heart rhythm, blood pressure, respiratory rate, and oxygen level. °· An EKG (ECG) to look at the electrical activity of your heart. °· Blood tests called cardiac markers are drawn at scheduled times to measure proteins or enzymes released by the damaged heart muscle. °· A chest X-ray. °· An echocardiogram to evaluate heart motion and blood flow. °· Coronary angiography (cardiac catheterization). This is a diagnostic procedure to look at the heart arteries. °TREATMENT  °Acute Intervention. For   an MI, the national standard in the [place] is to have an acute intervention in under 90 minutes from the time you get to the hospital. An acute intervention is a special procedure to open up the heart arteries. It is done in a treatment room called a "catheterization lab" (cath lab). Some hospitals do no have a cath lab. If you are having an MI and the hospital does not have a cath lab, the standard is to transport you  to a hospital that has one. In the cath lab, acute intervention includes:  Angioplasty. An angioplasty involves inserting a thin, flexible tube (catheter) into an artery in either your groin or wrist. The catheter is threaded to the heart arteries. A balloon at the end of the catheter is inflated to open a narrowed or blocked heart artery. During an angioplasty procedure, a small mesh tube (stent) may be used to keep the heart artery open. Depending on your condition and health history, one of two types of stents may be placed:  Drug-eluting stent (DES). A DES is coated with a medicine to prevent scar tissue from growing over the stent. With drug-eluting stents, blood thinning medicine will need to be taken for up to a year.  Bare metal stent. This type of stent has no special coating to keep tissue from growing over it. This type of stent is used if you cannot take blood thinning medicine for a prolonged time or you need surgery in the near future. After a bare metal stent is placed, blood thinning medicine will need to be taken for about a month.  If you are taking blood thinning medicine (anti-platelet therapy) after stent placement, do not stop taking it unless your caregiver says it is okay to do so. Make sure you understand how long you need to take the medicine. Surgical Intervention  If an acute intervention is not successful, surgery may be needed:  Open heart surgery (coronary artery bypass graft, CABG). CABG takes a vein (saphenous vein) from your leg. The vein is then attached to the blocked heart artery which bypasses the blockage. This then allows blood flow to the heart muscle. Additional Interventions  A "clot buster" medicine (thrombolytic) may be given. This medicine can help break up a clot in the heart artery. This medicine may be given if a person cannot get to a cath lab right away.  Intra-aortic balloon pump (IABP). If you have suffered a very severe MI and are too unstable  to go to the cath lab or to surgery, an IABP may be used. This is a temporary mechanical device used to increase blood flow to the heart and reduce the workload of the heart until you are stable enough to go to the cath lab or surgery. HOME CARE INSTRUCTIONS After an MI, you may need the following:  Medicine. Take medicine as directed by your caregiver. Medicines after an MI may:  Keep your blood from clotting easily (blood thinners).  Control your blood pressure.  Help lower your cholesterol.  Control abnormal heart rhythms.  Lifestyle changes. Under the guidance of your caregiver, lifestyle changes include:  Quitting smoking, if you smoke. Your caregiver can help you quit.  Being physically active.  Maintaining a healthy weight.  Eating a heart healthy diet. A dietitian can help you learn healthy eating options.  Managing diabetes.  Reducing stress.  Limiting alcohol intake. SEEK IMMEDIATE MEDICAL CARE IF:   You have severe chest pain, especially if the pain is crushing  or pressure-like and spreads to the arms, back, neck, or jaw. This is an emergency. Do not wait to see if the pain will go away. Get medical help at once. Call your local emergency services (911 in the [place] Do not drive yourself to the hospital. °· You have shortness of breath during rest, sleep, or with activity. °· You have sudden sweating or clammy skin. °· You feel sick to your stomach (nauseous) and throw up (vomit). °· You suddenly become lightheaded or dizzy. °· You feel your heart beating rapidly or you notice "skipped" beats. °MAKE SURE YOU:  °· Understand these instructions. °· Will watch your condition. °· Will get help right away if you are not doing well or get worse. °Document Released: 12/02/2005 Document Revised: 12/07/2013 Document Reviewed: 02/04/2014 °ExitCare® Patient Information ©2015 [employer]. This information is not intended to replace advice given to you by your health care provider.  Make sure you discuss any questions you have with your health care provider. °Radial Site Care °Refer to this sheet in the next few weeks. These instructions provide you with information on caring for yourself after your procedure. Your caregiver may also give you more specific instructions. Your treatment has been planned according to current medical practices, but problems sometimes occur. Call your caregiver if you have any problems or questions after your procedure. °HOME CARE INSTRUCTIONS °· You may shower the day after the procedure. Remove the bandage (dressing) and gently wash the site with plain soap and water. Gently pat the site dry. °· Do not apply powder or lotion to the site. °· Do not submerge the affected site in water for 3 to 5 days. °· Inspect the site at least twice daily. °· Do not flex or bend the affected arm for 24 hours. °· No lifting over 5 pounds (2.3 kg) for 5 days after your procedure. °· Do not drive home if you are discharged the same day of the procedure. Have someone else drive you. °· You may drive 24 hours after the procedure unless otherwise instructed by your caregiver. °· Do not operate machinery or power tools for 24 hours. °· A responsible adult should be with you for the first 24 hours after you arrive home. °What to expect: °· Any bruising will usually fade within 1 to 2 weeks. °· Blood that collects in the tissue (hematoma) may be painful to the touch. It should usually decrease in size and tenderness within 1 to 2 weeks. °SEEK IMMEDIATE MEDICAL CARE IF: °· You have unusual pain at the radial site. °· You have redness, warmth, swelling, or pain at the radial site. °· You have drainage (other than a small amount of blood on the dressing). °· You have chills. °· You have a fever or persistent symptoms for more than 72 hours. °· You have a fever and your symptoms suddenly get worse. °· Your arm becomes pale, cool, tingly, or numb. °· You have heavy bleeding from the site. Hold  pressure on the site. °Document Released: 01/04/2011 Document Revised: 02/24/2012 Document Reviewed: 01/04/2011 °ExitCare® Patient Information ©2015 [employer]. This information is not intended to replace advice given to you by your health care provider. Make sure you discuss any questions you have with your health care provider. °Coronary Angiogram With Stent, Care After °Refer to this sheet in the next few weeks. These instructions provide you with information on caring for yourself after your procedure. Your health care provider may also give you more specific instructions. Your treatment has   been planned according to current medical practices, but problems sometimes occur. Call your health care provider if you have any problems or questions after your procedure.  °WHAT TO EXPECT AFTER THE PROCEDURE  °The insertion site may be tender for a few days after your procedure. °HOME CARE INSTRUCTIONS  °· Take medicines only as directed by your health care provider. Blood thinners may be prescribed after your procedure to improve blood flow through the stent. °· Change any bandages (dressings) as directed by your health care provider.   °· Check your insertion site every day for redness, swelling, or fluid leaking from the insertion.   °· Do not take baths, swim, or use a hot tub until your health care provider approves. You may shower. Pat the insertion area dry. Do not rub the insertion area with a washcloth or towel.   °· Eat a heart-healthy diet. This should include plenty of fresh fruits and vegetables. Meat should be lean cuts. Avoid the following types of food:   °· Food that is high in salt.   °· Canned or highly processed food.   °· Food that is high in saturated fat or sugar.   °· Fried food.   °· Make any other lifestyle changes recommended by your health care provider. This may include:   °· Not using any tobacco products including cigarettes, chewing tobacco, or electronic cigarettes.  °· Managing your  weight.   °· Getting regular exercise.   °· Managing your blood pressure.   °· Limiting your alcohol intake.   °· Managing other health problems, such as diabetes.   °· If you need an MRI after your heart stent was placed, be sure to tell the health care provider who orders the MRI that you have a heart stent.   °· Keep all follow-up visits as directed by your health care provider.   °SEEK IMMEDIATE MEDICAL CARE IF:  °· You develop chest pain, shortness of breath, feel faint, or pass out. °· You have bleeding, swelling larger than a walnut, or drainage from the catheter insertion site. °· You develop pain, discoloration, coldness, or severe bruising in the leg or arm that held the catheter. °· You develop bleeding from any other place such as from the bowels. There may be bright red blood in the urine or stools, or it may appear as black, tarry stools. °· You have a fever or chills. °MAKE SURE YOU: °· Understand these instructions. °· Will watch your condition. °· Will get help right away if you are not doing well or get worse. °Document Released: 06/21/2005 Document Revised: 04/18/2014 Document Reviewed: 05/05/2013 °ExitCare® Patient Information ©2015 [employer]. This information is not intended to replace advice given to you by your health care provider. Make sure you discuss any questions you have with your health care provider. ° °

## 2015-05-29 ENCOUNTER — Telehealth: Payer: Self-pay | Admitting: Interventional Cardiology

## 2015-05-29 NOTE — Telephone Encounter (Signed)
Spoke to wife. She states she makes all the appointments.  Patient contacted regarding discharge from [facility] on 05/27/15.  Patient understands to follow up with provider Cecilie Kicks on 06/07/15 at 9 am at [facility]. Patient understands discharge instructions? yes Patient understands medications and regiment? yes Patient understands to bring all medications to this visit? Yes  Wife states patient states does not have any money for appointment. [facility]. She states he does have medicare. RN gave wife main phone number to [address] office and direct line to [employer] (in billing) to ask question about  Payment. She voiced understanding.

## 2015-05-29 NOTE — Telephone Encounter (Signed)
Needs a TOC Phone  Call .Marland Kitchen Appt is 06/07/15 at 9am with Cecilie Kicks

## 2015-06-07 ENCOUNTER — Encounter: Payer: Self-pay | Admitting: Cardiology

## 2015-06-07 ENCOUNTER — Ambulatory Visit (INDEPENDENT_AMBULATORY_CARE_PROVIDER_SITE_OTHER): Payer: Non-veteran care | Admitting: Cardiology

## 2015-06-07 VITALS — BP 120/80 | HR 58 | Ht 67.0 in | Wt 230.2 lb

## 2015-06-07 DIAGNOSIS — I2510 Atherosclerotic heart disease of native coronary artery without angina pectoris: Secondary | ICD-10-CM

## 2015-06-07 DIAGNOSIS — E785 Hyperlipidemia, unspecified: Secondary | ICD-10-CM | POA: Diagnosis not present

## 2015-06-07 DIAGNOSIS — I25708 Atherosclerosis of coronary artery bypass graft(s), unspecified, with other forms of angina pectoris: Secondary | ICD-10-CM | POA: Diagnosis not present

## 2015-06-07 DIAGNOSIS — I10 Essential (primary) hypertension: Secondary | ICD-10-CM | POA: Diagnosis not present

## 2015-06-07 DIAGNOSIS — N183 Chronic kidney disease, stage 3 unspecified: Secondary | ICD-10-CM

## 2015-06-07 LAB — BASIC METABOLIC PANEL
BUN: 20 mg/dL (ref 6–23)
CO2: 26 meq/L (ref 19–32)
CREATININE: 1.43 mg/dL (ref 0.40–1.50)
Calcium: 9.6 mg/dL (ref 8.4–10.5)
Chloride: 104 mEq/L (ref 96–112)
GFR: 52.61 mL/min — ABNORMAL LOW (ref >60.00)
Glucose, Bld: 90 mg/dL (ref 70–99)
Potassium: 4 mEq/L (ref 3.5–5.1)
Sodium: 139 mEq/L (ref 135–145)

## 2015-06-07 MED ORDER — NITROGLYCERIN 0.4 MG SL SUBL: 0.4000 mg | SUBLINGUAL_TABLET | SUBLINGUAL | Status: AC | PRN

## 2015-06-07 MED ORDER — NITROGLYCERIN 0.4 MG SL SUBL: 0.4000 mg | SUBLINGUAL_TABLET | SUBLINGUAL | Status: DC | PRN

## 2015-06-07 NOTE — Patient Instructions (Addendum)
You must be on Brilinta or Plavix for the stent.  Depending what [facility] would [facility].  Decrease ASA to 81 mg daily, this will help decrease bruising.    Your physician recommends that you schedule a follow-up appointment with Dr. Glori Bickers available.   Lab work to be done today--BMP

## 2015-06-07 NOTE — Progress Notes (Signed)
Cardiology Office Note   Date:  06/07/2015   ID:  [name], DOB [birth year], MRN [id number]  PCP:  Alessandra Grout, MD  Cardiologist:  Dr. Percival Spanish    Chief Complaint  Patient presents with  . Hospitalization Follow-up    STEMI, no recurrent pain      History of Present Illness: Clinton Baldwin is a 64 y.o. male who presents for post MI-STEMI   Not feeling as well today. Just less energy.  Yesterday and since discharge without problems.  EKG stable.  No chest pain or SOB.  He has been walking some and eating healthy-he noted his wt loss since eating more heart healthy.  He complained of bruising easily and upon review of meds he is taking brilinta and ASA 325 mg daily.  He will decrease the ASA to 81 mg.  Other issue is he obtains his meds from the [place] and he does not believe he can obtain Brilinta-  He knows he must be on brilinta or plavix.    His cardiac hx includes status post prior ST elevation MI and subsequent coronary artery bypass grafting 5 in 2010. In addition to CAD he has rheumatoid arthritis and chronic pain. He was last seen in clinic in 2013. He was in his usual state of health until 05/23/15 when he began to experience severe substernal chest pressure. He stayed home for about 2 hours prior to calling EMS. He was found to have inferior ST segment elevation. [facility] where diagnostic catheterization has shown severe disease within the sequential vein graft to the OM2 and OM 3. He underwent successful PCI and bare metal stenting with a total of 4 Rebel bare metal stents placed, covering a total of 112 mm.he was d/c'd on 6/11/6. pk troponin was 5.47.  LDL 68.   Past Medical History  Diagnosis Date  . Hypertension   . Dyslipidemia   . GERD (gastroesophageal reflux disease)   . History of pulmonary embolism     years ago  . Atrial fibrillation     post op  . Coronary artery disease     a. s/p STEMI 11/2009-->s/p CABG x 5:  LIMA->LAD, VG->Diag, VG->OM2->OM3, VG->dRCA;  b. 05/23/2015 Inf STEMI/PCI: LM nl, LAD 65p/m, LCX 100, OM/2/3 small, RCA 75m RPDA small, VG->dRCA 30p, VG->OM2->OM3 99 prox to OM2, 100 prox to OM3 (Rebel BMS's x 4), VG->Diag ok, LIMA->LAD ok.  . 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 Leukocytosis     referred to hematology  . Rheumatoid arthritis   . Carpal tunnel syndrome   . Chronic pain     a. uses Marijuana for severe pain.  . Morbid obesity     Past Surgical History  Procedure Laterality Date  . Coronary artery bypass graft  11/30/2009    x5. LIMA to LAD, SVG to diagonal branch of LAD, SVG sequentially to OM2 and 3, SVG to distal RCA with EVH from the right leg   . Hemorrhoid surgery    . Cardiac catheterization N/A 05/23/2015    Procedure: Left Heart Cath and Coronary Angiography;  Surgeon: HBelva Crome MD;  [facility];  Service: Cardiovascular;  Laterality: N/A;  . Cardiac catheterization N/A 05/23/2015    Procedure: Coronary Stent Intervention;  Surgeon: HBelva Crome MD;  [facility];  Service: Cardiovascular;  Laterality: N/A;     Current  Outpatient Prescriptions  Medication Sig Dispense Refill  . acetaminophen (TYLENOL) 325 MG tablet Take 2 tablets (650 mg total) by mouth every 4 (four) hours as needed for headache or mild pain.    Marland Kitchen allopurinol (ZYLOPRIM) 100 MG tablet Take 100 mg by mouth daily.    Marland Kitchen aspirin 81 MG chewable tablet Chew 1 tablet (81 mg total) by mouth daily.    . Calcium Carbonate-Vitamin D 600-200 MG-UNIT TABS Take 1 tablet by mouth daily.    . Cholecalciferol (VITAMIN D-3) 1000 UNITS CAPS Take 1 capsule by mouth daily.    Marland Kitchen etanercept (ENBREL) 50 MG/ML injection Inject 50 mg into the skin once a week.    . folic acid (FOLVITE) 1 MG tablet Take 1 mg by mouth daily.    . hydrochlorothiazide (MICROZIDE) 12.5 MG capsule Take 12.5 mg by mouth daily.    . isosorbide mononitrate (IMDUR) 30 MG 24 hr  tablet Take 1 tablet (30 mg total) by mouth daily. 30 tablet 0  . lisinopril (PRINIVIL,ZESTRIL) 40 MG tablet Take 40 mg by mouth daily.      . metoprolol (LOPRESSOR) 50 MG tablet Take 25 mg by mouth 2 (two) times daily.    . nitroGLYCERIN (NITROSTAT) 0.4 MG SL tablet Place 1 tablet (0.4 mg total) under the tongue every 5 (five) minutes as needed for chest pain. 25 tablet 2  . pantoprazole (PROTONIX) 40 MG tablet Take 1 tablet (40 mg total) by mouth daily. 30 tablet 11  . pravastatin (PRAVACHOL) 20 MG tablet Take 20 mg by mouth daily.    . ticagrelor (BRILINTA) 90 MG TABS tablet Take 1 tablet (90 mg total) by mouth 2 (two) times daily. 60 tablet 10  . traMADol (ULTRAM) 50 MG tablet Take by mouth 3 (three) times daily as needed for moderate pain.     No current facility-administered medications for this visit.    Allergies:   Review of patient's allergies indicates no known allergies.    Social History:  The patient  reports that he has quit smoking. He does not have any smokeless tobacco history on file. He reports that he uses illicit drugs (Marijuana). He reports that he does not drink alcohol.    Family History:  The patient's family history includes Diabetes in his mother; Heart attack (age of onset: 41) in his brother and brother.    ROS:  General:no colds or fevers, no weight changes Skin:no rashes or ulcers HEENT:no blurred vision, no congestion CV:see HPI PUL:see HPI GI:no diarrhea constipation or melena, no indigestion GU:no hematuria, no dysuria MS:no joint pain, no claudication Neuro:no syncope, no lightheadedness Endo:no diabetes, no thyroid disease  Wt Readings from Last 3 Encounters:  06/07/15 230 lb 3.2 oz (104.418 kg)  05/26/15 236 lb 1.6 oz (107.094 kg)  01/31/14 239 lb 8 oz (108.636 kg)     PHYSICAL EXAM: VS:  BP 120/80 mmHg  Pulse 58  Ht '5\' 7"'$  (1.702 m)  Wt 230 lb 3.2 oz (104.418 kg)  BMI 36.05 kg/m2 , BMI Body mass index is 36.05  kg/(m^2). General:Pleasant affect, NAD Skin:Warm and dry, brisk capillary refill HEENT:normocephalic, sclera clear, mucus membranes moist Neck:supple, no JVD, no bruits  Heart:S1S2 RRR without murmur, gallup, rub or click Lungs:clear without rales, rhonchi, or wheezes CBJ:SEGB, non tender, + BS, do not palpate liver spleen or masses Ext:no lower ext edema, 2+ pedal pulses, 2+ radial pulses Neuro:alert and oriented X 3, MAE, follows commands, + facial symmetry    EKG:  EKG  is ordered today. The ekg ordered today demonstrates SB with evolutionary changes of inf wall MI with t wave inversion in II,III, AVF. Otherwise stable EKG.     Recent Labs: 05/24/2015: ALT 18; BUN 15; Creatinine, Ser 1.50*; Hemoglobin 13.4; Platelets 190; Potassium 3.7; Sodium 139    Lipid Panel    Component Value Date/Time   CHOL 131 05/24/2015 0226   TRIG 108 05/24/2015 0226   HDL 41 05/24/2015 0226   CHOLHDL 3.2 05/24/2015 0226   VLDL 22 05/24/2015 0226   LDLCALC 68 05/24/2015 0226       Other studies Reviewed: Additional studies/ records that were reviewed today include: EKG, HOSPITAL NOTES .   ASSESSMENT AND PLAN:  1. STEMI inferior wall 05/23/15 with S/P BMS to SVG-OM2-OM3 05/23/15- EKG with post MI changes, no chest pain. Follow up with Dr. Percival Spanish next available, he is to be seen at [facility] in [place] in early July. 2.  CAD Hx of CABG x 5 2010-needs NTG- new prescription written 3.  Essential hypertension, benign- stable today 4.  Hyperlipidemia- controlled continue statin 5.  Rheumatic disease 6.  Hx of Atrial fibrillation post op 2010  7. CKD 3 check BMP   Current medicines are reviewed with the patient today.  The patient Has concerns regarding medicines due to financial reasons he will receive meds from [place] in [place].  Also bruising with brilinta.  But he will decrease ASA to 81 mg daily.    The following changes have been made:  See above Labs/ tests ordered today include:see  above  Disposition:   FU:  see above  Lennie Muckle, NP  06/07/2015 9:27 AM    [facility] [place], [place], [place], [place] Phone: [phone]; Fax: [phone]

## 2015-06-08 NOTE — Addendum Note (Signed)
Addended by: Isaiah Serge on: 06/08/2015 07:53 AM   Modules accepted: Level of Service

## 2015-09-07 ENCOUNTER — Ambulatory Visit (INDEPENDENT_AMBULATORY_CARE_PROVIDER_SITE_OTHER): Payer: Non-veteran care | Admitting: Cardiology

## 2015-09-07 ENCOUNTER — Encounter: Payer: Self-pay | Admitting: Cardiology

## 2015-09-07 VITALS — BP 158/94 | HR 64 | Ht 66.0 in | Wt 234.3 lb

## 2015-09-07 DIAGNOSIS — I2510 Atherosclerotic heart disease of native coronary artery without angina pectoris: Secondary | ICD-10-CM

## 2015-09-07 DIAGNOSIS — Z9861 Coronary angioplasty status: Secondary | ICD-10-CM | POA: Diagnosis not present

## 2015-09-07 NOTE — Patient Instructions (Signed)
Your physician wants you to follow-up in: 6 Months You will receive a reminder letter in the mail two months in advance. If you don't receive a letter, please call our office to schedule the follow-up appointment.  

## 2015-09-07 NOTE — Progress Notes (Signed)
Cardiology Office Note   Date:  09/07/2015   ID:  Clinton Baldwin, DOB Jun 08, 1951, MRN [id number]  PCP:  Alessandra Grout, MD  Cardiologist:   Minus Breeding, MD   Chief Complaint  Patient presents with  . Chest Pain  . Shortness of Breath      History of Present Illness: Clinton Baldwin is a 64 y.o. male who presents for follow-up of coronary disease status post stenting as described below. Since his stenting June he's done well. He's had one episode where he took a nitroglycerin. He has some symptoms of what he thinks is heartburn. However, he's not having any pain that was his previous angina. He pedals a stationary bicycle. He is limited by some back pain. 20 pills the bicycle is not describing any chest pressure, neck or arm discomfort. He denies any palpitations, presyncope or syncope. He's had no PND or orthopnea.  Past Medical History  Diagnosis Date  . Hypertension   . Dyslipidemia   . GERD (gastroesophageal reflux disease)   . History of pulmonary embolism     years ago  . Atrial fibrillation     post op  . Coronary artery disease     a. s/p STEMI 11/2009-->s/p CABG x 5: LIMA->LAD, VG->Diag, VG->OM2->OM3, VG->dRCA;  b. 05/23/2015 Inf STEMI/PCI: LM nl, LAD 65p/m, LCX 100, OM/2/3 small, RCA 12m RPDA small, VG->dRCA 30p, VG->OM2->OM3 99 prox to OM2, 100 prox to OM3 (Rebel BMS's x 4), VG->Diag ok, LIMA->LAD ok.  . 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 Leukocytosis     referred to hematology  . Rheumatoid arthritis   . Carpal tunnel syndrome   . Chronic pain     a. uses Marijuana for severe pain.  . Morbid obesity     Past Surgical History  Procedure Laterality Date  . Coronary artery bypass graft  11/30/2009    x5. LIMA to LAD, SVG to diagonal branch of LAD, SVG sequentially to OM2 and 3, SVG to distal RCA with EVH from the right leg   . Hemorrhoid surgery    . Cardiac catheterization N/A 05/23/2015    Procedure: Left Heart Cath  and Coronary Angiography;  Surgeon: HBelva Crome MD;  [facility];  Service: Cardiovascular;  Laterality: N/A;  . Cardiac catheterization N/A 05/23/2015    Procedure: Coronary Stent Intervention;  Surgeon: HBelva Crome MD;  [facility];  Service: Cardiovascular;  Laterality: N/A;     Current Outpatient Prescriptions  Medication Sig Dispense Refill  . acetaminophen (TYLENOL) 325 MG tablet Take 2 tablets (650 mg total) by mouth every 4 (four) hours as needed for headache or mild pain.    .Marland Kitchenallopurinol (ZYLOPRIM) 100 MG tablet Take 100 mg by mouth daily.    .Marland Kitchenaspirin 81 MG chewable tablet Chew 1 tablet (81 mg total) by mouth daily.    . Calcium Carbonate-Vitamin D 600-200 MG-UNIT TABS Take 1 tablet by mouth daily.    . Cholecalciferol (VITAMIN D-3) 1000 UNITS CAPS Take 1 capsule by mouth daily.    .Marland Kitchenetanercept (ENBREL) 50 MG/ML injection Inject 50 mg into the skin once a week.    . folic acid (FOLVITE) 1 MG tablet Take 1 mg by mouth daily.    . hydrochlorothiazide (MICROZIDE) 12.5 MG capsule Take 12.5 mg by mouth daily.    . isosorbide mononitrate (IMDUR) 30 MG 24 hr tablet Take 1 tablet (  30 mg total) by mouth daily. 30 tablet 0  . lisinopril (PRINIVIL,ZESTRIL) 40 MG tablet Take 40 mg by mouth daily.      . metoprolol (LOPRESSOR) 50 MG tablet Take 25 mg by mouth 2 (two) times daily.    . nitroGLYCERIN (NITROSTAT) 0.4 MG SL tablet Place 1 tablet (0.4 mg total) under the tongue every 5 (five) minutes as needed for chest pain. 25 tablet 6  . pantoprazole (PROTONIX) 40 MG tablet Take 1 tablet (40 mg total) by mouth daily. 30 tablet 11  . pravastatin (PRAVACHOL) 20 MG tablet Take 20 mg by mouth daily.    . ticagrelor (BRILINTA) 90 MG TABS tablet Take 1 tablet (90 mg total) by mouth 2 (two) times daily. 60 tablet 10  . traMADol (ULTRAM) 50 MG tablet Take by mouth 3 (three) times daily as needed for moderate pain.     No current facility-administered medications for  this visit.    Allergies:   Review of patient's allergies indicates no known allergies.    ROS:  Please see the history of present illness.   Otherwise, review of systems are positive for none.   All other systems are reviewed and negative.    PHYSICAL EXAM: VS:  BP 158/94 mmHg  Pulse 64  Ht '5\' 6"'$  (1.676 m)  Wt 234 lb 4.8 oz (106.278 kg)  BMI 37.84 kg/m2 , BMI Body mass index is 37.84 kg/(m^2). GENERAL:  Well appearing HEENT:  Pupils equal round and reactive, fundi not visualized, oral mucosa unremarkable, poor dentition NECK:  No jugular venous distention, waveform within normal limits, carotid upstroke brisk and symmetric, no bruits, no thyromegaly LYMPHATICS:  No cervical, inguinal adenopathy LUNGS:  Clear to auscultation bilaterally BACK:  No CVA tenderness CHEST:  Well healed sternotomy scar. HEART:  PMI not displaced or sustained,S1 and S2 within normal limits, no S3, no S4, no clicks, no rubs, no murmurs ABD:  Flat, positive bowel sounds normal in frequency in pitch, no bruits, no rebound, no guarding, no midline pulsatile mass, no hepatomegaly, no splenomegaly EXT:  2 plus pulses throughout, no edema, no cyanosis no clubbing SKIN:  No rashes no nodules, tatoos NEURO:  Cranial nerves II through XII grossly intact, motor grossly intact throughout PSYCH:  Cognitively intact, oriented to person place and time    EKG:  EKG is not ordered today.    Recent Labs: 05/24/2015: ALT 18; Hemoglobin 13.4; Platelets 190 06/07/2015: BUN 20; Creatinine, Ser 1.43; Potassium 4.0; Sodium 139    Lipid Panel    Component Value Date/Time   CHOL 131 05/24/2015 0226   TRIG 108 05/24/2015 0226   HDL 41 05/24/2015 0226   CHOLHDL 3.2 05/24/2015 0226   VLDL 22 05/24/2015 0226   LDLCALC 68 05/24/2015 0226      Wt Readings from Last 3 Encounters:  09/07/15 234 lb 4.8 oz (106.278 kg)  06/07/15 230 lb 3.2 oz (104.418 kg)  05/26/15 236 lb 1.6 oz (107.094 kg)      Other studies  Reviewed: Additional studies/ records that were reviewed today include: None.    ASSESSMENT AND PLAN:  CAD:  The patient has no new symptoms. No further cardiovascular testing is suggested. He will continue with risk reduction.  HTN:  The blood pressure is elevated.  However,  this is unusual. No change in therapy is indicated.    Current medicines are reviewed at length with the patient today.  The patient does not have concerns regarding medicines.  The following changes  have been made:  no change  Labs/ tests ordered today include: None   Disposition:   FU with me in six months.      Signed, Minus Breeding, MD  09/07/2015 9:09 AM    [place]

## 2015-11-05 ENCOUNTER — Encounter (HOSPITAL_COMMUNITY): Payer: Self-pay | Admitting: Emergency Medicine

## 2015-11-05 ENCOUNTER — Emergency Department (HOSPITAL_COMMUNITY)
Admission: EM | Admit: 2015-11-05 | Discharge: 2015-11-05 | Disposition: A | Discharge status: Home / Self Care | Payer: Non-veteran care | Attending: Emergency Medicine | Admitting: Emergency Medicine

## 2015-11-05 DIAGNOSIS — Y9289 Other specified places as the place of occurrence of the external cause: Secondary | ICD-10-CM | POA: Diagnosis not present

## 2015-11-05 DIAGNOSIS — Z7901 Long term (current) use of anticoagulants: Secondary | ICD-10-CM | POA: Diagnosis not present

## 2015-11-05 DIAGNOSIS — Z79899 Other long term (current) drug therapy: Secondary | ICD-10-CM | POA: Diagnosis not present

## 2015-11-05 DIAGNOSIS — I4891 Unspecified atrial fibrillation: Secondary | ICD-10-CM | POA: Diagnosis not present

## 2015-11-05 DIAGNOSIS — G8929 Other chronic pain: Secondary | ICD-10-CM | POA: Diagnosis not present

## 2015-11-05 DIAGNOSIS — Z862 Personal history of diseases of the blood and blood-forming organs and certain disorders involving the immune mechanism: Secondary | ICD-10-CM | POA: Insufficient documentation

## 2015-11-05 DIAGNOSIS — I2510 Atherosclerotic heart disease of native coronary artery without angina pectoris: Secondary | ICD-10-CM | POA: Diagnosis not present

## 2015-11-05 DIAGNOSIS — I10 Essential (primary) hypertension: Secondary | ICD-10-CM | POA: Diagnosis not present

## 2015-11-05 DIAGNOSIS — X58XXXA Exposure to other specified factors, initial encounter: Secondary | ICD-10-CM | POA: Insufficient documentation

## 2015-11-05 DIAGNOSIS — Z7982 Long term (current) use of aspirin: Secondary | ICD-10-CM | POA: Insufficient documentation

## 2015-11-05 DIAGNOSIS — Z951 Presence of aortocoronary bypass graft: Secondary | ICD-10-CM | POA: Insufficient documentation

## 2015-11-05 DIAGNOSIS — M069 Rheumatoid arthritis, unspecified: Secondary | ICD-10-CM | POA: Insufficient documentation

## 2015-11-05 DIAGNOSIS — Y9389 Activity, other specified: Secondary | ICD-10-CM | POA: Diagnosis not present

## 2015-11-05 DIAGNOSIS — Y999 Unspecified external cause status: Secondary | ICD-10-CM | POA: Diagnosis not present

## 2015-11-05 DIAGNOSIS — Z86711 Personal history of pulmonary embolism: Secondary | ICD-10-CM | POA: Insufficient documentation

## 2015-11-05 DIAGNOSIS — K219 Gastro-esophageal reflux disease without esophagitis: Secondary | ICD-10-CM | POA: Diagnosis not present

## 2015-11-05 DIAGNOSIS — S39011A Strain of muscle, fascia and tendon of abdomen, initial encounter: Secondary | ICD-10-CM

## 2015-11-05 DIAGNOSIS — Z87891 Personal history of nicotine dependence: Secondary | ICD-10-CM | POA: Diagnosis not present

## 2015-11-05 DIAGNOSIS — E785 Hyperlipidemia, unspecified: Secondary | ICD-10-CM | POA: Diagnosis not present

## 2015-11-05 DIAGNOSIS — S76219A Strain of adductor muscle, fascia and tendon of unspecified thigh, initial encounter: Secondary | ICD-10-CM | POA: Insufficient documentation

## 2015-11-05 DIAGNOSIS — R1032 Left lower quadrant pain: Secondary | ICD-10-CM | POA: Diagnosis present

## 2015-11-05 DIAGNOSIS — Z9889 Other specified postprocedural states: Secondary | ICD-10-CM | POA: Insufficient documentation

## 2015-11-05 MED ORDER — IBUPROFEN 600 MG PO TABS: 600.0000 mg | ORAL_TABLET | Freq: Four times a day (QID) | ORAL | Status: DC | PRN

## 2015-11-05 MED ORDER — HYDROCODONE-ACETAMINOPHEN 5-325 MG PO TABS: 2.0000 | ORAL_TABLET | ORAL | Status: DC | PRN

## 2015-11-05 NOTE — ED Provider Notes (Signed)
CSN: [id number]     Arrival date & time 11/05/15  1005 History   First MD Initiated Contact with Patient 11/05/15 1029     Chief Complaint  Patient presents with  . Hernia      HPI Pt is concerned he may have hernia in left groin. Pt states this happened yesterday. Feels he may have strained picking up garbage yesterday. Past Medical History  Diagnosis Date  . Hypertension   . Dyslipidemia   . GERD (gastroesophageal reflux disease)   . History of pulmonary embolism     years ago  . Atrial fibrillation ([place])     post op  . Coronary artery disease     a. s/p STEMI 11/2009-->s/p CABG x 5: LIMA->LAD, VG->Diag, VG->OM2->OM3, VG->dRCA;  b. 05/23/2015 Inf STEMI/PCI: LM nl, LAD 65p/m, LCX 100, OM/2/3 small, RCA 93m RPDA small, VG->dRCA 30p, VG->OM2->OM3 99 prox to OM2, 100 prox to OM3 (Rebel BMS's x 4), VG->Diag ok, LIMA->LAD ok.  . 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 Leukocytosis     referred to hematology  . [place]   . Carpal tunnel syndrome   . Chronic pain     a. uses Marijuana for severe pain.  . Morbid obesity ([facility]    Past Surgical History  Procedure Laterality Date  . Coronary artery bypass graft  11/30/2009    x5. LIMA to LAD, SVG to diagonal branch of LAD, SVG sequentially to OM2 and 3, SVG to distal RCA with EVH from the right leg   . Hemorrhoid surgery    . Cardiac catheterization N/A 05/23/2015    Procedure: Left Heart Cath and Coronary Angiography;  Surgeon: HBelva Crome MD;  [facility];  Service: Cardiovascular;  Laterality: N/A;  . Cardiac catheterization N/A 05/23/2015    Procedure: Coronary Stent Intervention;  Surgeon: HBelva Crome MD;  [facility];  Service: Cardiovascular;  Laterality: N/A;   Family History  Problem Relation Age of Onset  . Diabetes Mother   . Heart attack Brother 529 . Heart attack Brother 593  Social History  Substance Use Topics  . Smoking status:  Former SResearch scientist (life sciences) . Smokeless tobacco: None  . Alcohol Use: No    Review of Systems  All other systems reviewed and are negative.     Allergies  Review of patient's allergies indicates no known allergies.  Home Medications   Prior to Admission medications   Medication Sig Start Date End Date Taking? Authorizing Provider  acetaminophen (TYLENOL) 325 MG tablet Take 2 tablets (650 mg total) by mouth every 4 (four) hours as needed for headache or mild pain. 05/27/15   LErlene Quan PA-C  allopurinol (ZYLOPRIM) 100 MG tablet Take 100 mg by mouth daily.    Historical Provider, MD  aspirin 81 MG chewable tablet Chew 1 tablet (81 mg total) by mouth daily. 05/27/15   LErlene Quan PA-C  Calcium Carbonate-Vitamin D 600-200 MG-UNIT TABS Take 1 tablet by mouth daily.    Historical Provider, MD  Cholecalciferol (VITAMIN D-3) 1000 UNITS CAPS Take 1 capsule by mouth daily.    Historical Provider, MD  clopidogrel (PLAVIX) 75 MG tablet Take 75 mg by mouth daily.    Historical Provider, MD  etanercept (ENBREL) 50 MG/ML injection Inject 50 mg into the skin once a week.    Historical Provider, MD  folic acid (FOLVITE) 1 MG tablet Take 1  mg by mouth daily.    Historical Provider, MD  hydrochlorothiazide (MICROZIDE) 12.5 MG capsule Take 12.5 mg by mouth daily.    Historical Provider, MD  HYDROcodone-acetaminophen (NORCO/VICODIN) 5-325 MG tablet Take 2 tablets by mouth every 4 (four) hours as needed. 11/05/15   Leonard Schwartz, MD  ibuprofen (ADVIL,MOTRIN) 600 MG tablet Take 1 tablet (600 mg total) by mouth every 6 (six) hours as needed. 11/05/15   Leonard Schwartz, MD  isosorbide mononitrate (IMDUR) 30 MG 24 hr tablet Take 1 tablet (30 mg total) by mouth daily. 01/31/14   Shanker Kristeen Mans, MD  lisinopril (PRINIVIL,ZESTRIL) 40 MG tablet Take 40 mg by mouth daily.      Historical Provider, MD  metoprolol (LOPRESSOR) 50 MG tablet Take 25 mg by mouth 2 (two) times daily.    Historical Provider, MD  nitroGLYCERIN  (NITROSTAT) 0.4 MG SL tablet Place 1 tablet (0.4 mg total) under the tongue every 5 (five) minutes as needed for chest pain. 06/07/15   Isaiah Serge, NP  pantoprazole (PROTONIX) 40 MG tablet Take 1 tablet (40 mg total) by mouth daily. 05/27/15   Erlene Quan, PA-C  pravastatin (PRAVACHOL) 20 MG tablet Take 20 mg by mouth daily.    Historical Provider, MD   BP 101/69 mmHg  Pulse 56  Temp(Src) 98.1 F (36.7 C) (Oral)  Resp 18  Ht '5\' 7"'$  (1.702 m)  Wt 229 lb (103.874 kg)  BMI 35.86 kg/m2  SpO2 97% Physical Exam  Constitutional: He is oriented to person, place, and time. He appears well-developed and well-nourished. No distress.  HENT:  Head: Normocephalic and atraumatic.  Eyes: Pupils are equal, round, and reactive to light.  Neck: Normal range of motion.  Cardiovascular: Normal rate and intact distal pulses.   Pulmonary/Chest: No respiratory distress.  Abdominal: Normal appearance. He exhibits no distension. No hernia.    Musculoskeletal: Normal range of motion.  Neurological: He is alert and oriented to person, place, and time. No cranial nerve deficit.  Skin: Skin is warm and dry. No rash noted.  Psychiatric: He has a normal mood and affect. His behavior is normal.  Nursing note and vitals reviewed.   ED Course  Procedures (including critical care time) Labs Review Labs Reviewed - No data to display  .    MDM   Final diagnoses:  Inguinal strain, initial encounter        Leonard Schwartz, MD 11/05/15 1146

## 2015-11-05 NOTE — ED Notes (Signed)
Pt is concerned he may have hernia in left groin. Pt states this happened yesterday. Feels he may have strained picking up garbage yesterday.

## 2015-11-05 NOTE — Discharge Instructions (Signed)
Muscle Strain A muscle strain (pulled muscle) happens when a muscle is stretched beyond normal length. It happens when a sudden, violent force stretches your muscle too far. Usually, a few of the fibers in your muscle are torn. Muscle strain is common in athletes. Recovery usually takes 1-2 weeks. Complete healing takes 5-6 weeks.  HOME CARE   Follow the PRICE method of treatment to help your injury get better. Do this the first 2-3 days after the injury:  Protect. Protect the muscle to keep it from getting injured again.  Rest. Limit your activity and rest the injured body part.  Ice. Put ice in a plastic bag. Place a towel between your skin and the bag. Then, apply the ice and leave it on from 15-20 minutes each hour. After the third day, switch to moist heat packs.  Compression. Use a splint or elastic bandage on the injured area for comfort. Do not put it on too tightly.  Elevate. Keep the injured body part above the level of your heart.  Only take medicine as told by your doctor.  Warm up before doing exercise to prevent future muscle strains. GET HELP IF:   You have more pain or puffiness (swelling) in the injured area.  You feel numbness, tingling, or notice a loss of strength in the injured area. MAKE SURE YOU:   Understand these instructions.  Will watch your condition.  Will get help right away if you are not doing well or get worse.   This information is not intended to replace advice given to you by your health care provider. Make sure you discuss any questions you have with your health care provider.   Document Released: 09/10/2008 Document Revised: 09/22/2013 Document Reviewed: 07/01/2013 Elsevier Interactive Patient Education [employer].

## 2016-03-14 ENCOUNTER — Encounter: Payer: Self-pay | Admitting: Cardiology

## 2016-03-14 ENCOUNTER — Ambulatory Visit (INDEPENDENT_AMBULATORY_CARE_PROVIDER_SITE_OTHER): Payer: Non-veteran care | Admitting: Cardiology

## 2016-03-14 ENCOUNTER — Telehealth: Payer: Self-pay | Admitting: Cardiology

## 2016-03-14 VITALS — BP 164/78 | HR 62 | Ht 66.0 in | Wt 236.0 lb

## 2016-03-14 DIAGNOSIS — Z9861 Coronary angioplasty status: Secondary | ICD-10-CM | POA: Diagnosis not present

## 2016-03-14 DIAGNOSIS — I2510 Atherosclerotic heart disease of native coronary artery without angina pectoris: Secondary | ICD-10-CM | POA: Diagnosis not present

## 2016-03-14 NOTE — Patient Instructions (Signed)
Your physician wants you to follow-up in: 1 Year. You will receive a reminder letter in the mail two months in advance. If you don't receive a letter, please call our office to schedule the follow-up appointment.  

## 2016-03-14 NOTE — Telephone Encounter (Signed)
[facility] fax [phone].ZMO:[id number] Jabier Mutton

## 2016-03-14 NOTE — Progress Notes (Signed)
Cardiology Office Note   Date:  03/14/2016   ID:  Clinton Baldwin, DOB [birth year], MRN [id number]  PCP:  Alessandra Grout, MD  Cardiologist:   Minus Breeding, MD   No chief complaint on file.     History of Present Illness: Clinton Baldwin is a 65 y.o. male who presents for follow-up of coronary disease status post stenting as described below. Since his stenting June of last year he's done well. He has not required any nitroglycerin.  He pedals a stationary bicycle  And he is not describing any chest pressure, neck or arm discomfort. He denies any palpitations, presyncope or syncope. He's had no PND or orthopnea.   He wants to have cataract surgery and needs cardiac clearance.    Past Medical History  Diagnosis Date  . Hypertension   . Dyslipidemia   . GERD (gastroesophageal reflux disease)   . History of pulmonary embolism     years ago  . Atrial fibrillation ([place])     post op  . Coronary artery disease     a. s/p STEMI 11/2009-->s/p CABG x 5: LIMA->LAD, VG->Diag, VG->OM2->OM3, VG->dRCA;  b. 05/23/2015 Inf STEMI/PCI: LM nl, LAD 65p/m, LCX 100, OM/2/3 small, RCA 71m RPDA small, VG->dRCA 30p, VG->OM2->OM3 99 prox to OM2, 100 prox to OM3 (Rebel BMS's x 4), VG->Diag ok, LIMA->LAD ok.  . 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 Leukocytosis     referred to hematology  . Rheumatoid arthritis ([place]   . Carpal tunnel syndrome   . Chronic pain     a. uses Marijuana for severe pain.  . Morbid obesity ([facility]     Past Surgical History  Procedure Laterality Date  . Coronary artery bypass graft  11/30/2009    x5. LIMA to LAD, SVG to diagonal branch of LAD, SVG sequentially to OM2 and 3, SVG to distal RCA with EVH from the right leg   . Hemorrhoid surgery    . Cardiac catheterization N/A 05/23/2015    Procedure: Left Heart Cath and Coronary Angiography;  Surgeon: HBelva Crome MD;  [facility];  Service: Cardiovascular;  Laterality: N/A;    . Cardiac catheterization N/A 05/23/2015    Procedure: Coronary Stent Intervention;  Surgeon: HBelva Crome MD;  [facility];  Service: Cardiovascular;  Laterality: N/A;     Current Outpatient Prescriptions  Medication Sig Dispense Refill  . acetaminophen (TYLENOL) 325 MG tablet Take 2 tablets (650 mg total) by mouth every 4 (four) hours as needed for headache or mild pain.    .Marland Kitchenallopurinol (ZYLOPRIM) 100 MG tablet Take 100 mg by mouth daily.    .Marland Kitchenaspirin 81 MG chewable tablet Chew 1 tablet (81 mg total) by mouth daily.    . Calcium Carbonate-Vitamin D 600-200 MG-UNIT TABS Take 1 tablet by mouth daily.    . Cholecalciferol (VITAMIN D-3) 1000 UNITS CAPS Take 1 capsule by mouth daily.    . clopidogrel (PLAVIX) 75 MG tablet Take 75 mg by mouth daily.    .Marland Kitchenetanercept (ENBREL) 50 MG/ML injection Inject 50 mg into the skin once a week.    . folic acid (FOLVITE) 1 MG tablet Take 1 mg by mouth daily.    . hydrochlorothiazide (MICROZIDE) 12.5 MG capsule Take 12.5 mg by mouth daily.    .Marland KitchenHYDROcodone-acetaminophen (NORCO/VICODIN) 5-325 MG tablet Take 2 tablets by mouth every 4 (four) hours as needed. 2[place]  tablet 0  . ibuprofen (ADVIL,MOTRIN) 600 MG tablet Take 1 tablet (600 mg total) by mouth every 6 (six) hours as needed. 30 tablet 0  . isosorbide mononitrate (IMDUR) 30 MG 24 hr tablet Take 1 tablet (30 mg total) by mouth daily. 30 tablet 0  . lisinopril (PRINIVIL,ZESTRIL) 40 MG tablet Take 40 mg by mouth daily.      . metoprolol (LOPRESSOR) 50 MG tablet Take 25 mg by mouth 2 (two) times daily.    . nitroGLYCERIN (NITROSTAT) 0.4 MG SL tablet Place 1 tablet (0.4 mg total) under the tongue every 5 (five) minutes as needed for chest pain. 25 tablet 6  . pantoprazole (PROTONIX) 40 MG tablet Take 1 tablet (40 mg total) by mouth daily. 30 tablet 11  . pravastatin (PRAVACHOL) 20 MG tablet Take 20 mg by mouth daily.    . traMADol (ULTRAM) 50 MG tablet Take 50 mg by mouth every 6 (six) hours as  needed.     No current facility-administered medications for this visit.    Allergies:   Review of patient's allergies indicates no known allergies.    ROS:  Please see the history of present illness.   Otherwise, review of systems are positive for none.   All other systems are reviewed and negative.    PHYSICAL EXAM: VS:  BP 164/78 mmHg  Pulse 62  Ht '5\' 6"'$  (1.676 m)  Wt 236 lb (107.049 kg)  BMI 38.11 kg/m2 , BMI Body mass index is 38.11 kg/(m^2). GENERAL:  Well appearing NECK:  No jugular venous distention, waveform within normal limits, carotid upstroke brisk and symmetric, no bruits, no thyromegaly LYMPHATICS:  No cervical, inguinal adenopathy LUNGS:  Clear to auscultation bilaterally BACK:  No CVA tenderness CHEST:  Well healed sternotomy scar. HEART:  PMI not displaced or sustained,S1 and S2 within normal limits, no S3, no S4, no clicks, no rubs, no murmurs ABD:  Flat, positive bowel sounds normal in frequency in pitch, no bruits, no rebound, no guarding, no midline pulsatile mass, no hepatomegaly, no splenomegaly EXT:  2 plus pulses throughout, no edema, no cyanosis no clubbing SKIN:  No rashes no nodules, tatoos   EKG:  EKG is ordered today. Sinus rhythm, rate 63, axis within normal limits, intervals within normal limits, nonspecific lateral T-wave inversions unchanged from previous.  Recent Labs: 05/24/2015: ALT 18; Hemoglobin 13.4; Platelets 190 06/07/2015: BUN 20; Creatinine, Ser 1.43; Potassium 4.0; Sodium 139    Lipid Panel    Component Value Date/Time   CHOL 131 05/24/2015 0226   TRIG 108 05/24/2015 0226   HDL 41 05/24/2015 0226   CHOLHDL 3.2 05/24/2015 0226   VLDL 22 05/24/2015 0226   LDLCALC 68 05/24/2015 0226      Wt Readings from Last 3 Encounters:  03/14/16 236 lb (107.049 kg)  11/05/15 229 lb (103.874 kg)  09/07/15 234 lb 4.8 oz (106.278 kg)      Other studies Reviewed: Additional studies/ records that were reviewed today include:  None.    ASSESSMENT AND PLAN:  PRE PROCEDURE:    The patient needs cataract surgery. He is cleared from a cardiovascular standpoint as he has no active symptoms, this is not high risk procedure and has a high functional level.  Therefore, based on ACC/AHA guidelines, the patient would be at acceptable risk for the planned procedure without further cardiovascular testing.   He could hold his Plavix as needed. However, I would prefer he continues his aspirin. He will stop the Plavix permanently in  June.  CAD:  The patient has no new symptoms. No further cardiovascular testing is suggested. He will continue with risk reduction.  HTN:  The blood pressure is elevated.  However,  this is unusual. No change in therapy is indicated.   He takes his readings at home and they are normal.   Current medicines are reviewed at length with the patient today.  The patient does not have concerns regarding medicines.  The following changes have been made:  no change  Labs/ tests ordered today include: None   Disposition:   FU with me in 12 months.      Signed, Minus Breeding, MD  03/14/2016 2:06 PM    [facility]

## 2016-03-15 NOTE — Telephone Encounter (Signed)
Dr Percival Spanish office note was faxed to [facility] in [place] via faxed Machine.

## 2017-03-14 ENCOUNTER — Ambulatory Visit: Payer: Non-veteran care | Admitting: Cardiology

## 2017-09-06 ENCOUNTER — Emergency Department (HOSPITAL_COMMUNITY): Payer: Non-veteran care

## 2017-09-06 ENCOUNTER — Encounter (HOSPITAL_COMMUNITY): Payer: Self-pay | Admitting: Emergency Medicine

## 2017-09-06 ENCOUNTER — Emergency Department (HOSPITAL_COMMUNITY)
Admission: EM | Admit: 2017-09-06 | Discharge: 2017-09-07 | Disposition: A | Discharge status: Home / Self Care | Payer: Non-veteran care | Attending: Emergency Medicine | Admitting: Emergency Medicine

## 2017-09-06 DIAGNOSIS — R0781 Pleurodynia: Secondary | ICD-10-CM

## 2017-09-06 DIAGNOSIS — Z79899 Other long term (current) drug therapy: Secondary | ICD-10-CM | POA: Diagnosis not present

## 2017-09-06 DIAGNOSIS — E785 Hyperlipidemia, unspecified: Secondary | ICD-10-CM | POA: Diagnosis not present

## 2017-09-06 DIAGNOSIS — J208 Acute bronchitis due to other specified organisms: Secondary | ICD-10-CM | POA: Insufficient documentation

## 2017-09-06 DIAGNOSIS — I2510 Atherosclerotic heart disease of native coronary artery without angina pectoris: Secondary | ICD-10-CM | POA: Diagnosis not present

## 2017-09-06 DIAGNOSIS — F17200 Nicotine dependence, unspecified, uncomplicated: Secondary | ICD-10-CM | POA: Insufficient documentation

## 2017-09-06 DIAGNOSIS — I4891 Unspecified atrial fibrillation: Secondary | ICD-10-CM | POA: Insufficient documentation

## 2017-09-06 DIAGNOSIS — I10 Essential (primary) hypertension: Secondary | ICD-10-CM | POA: Insufficient documentation

## 2017-09-06 DIAGNOSIS — R0789 Other chest pain: Secondary | ICD-10-CM | POA: Insufficient documentation

## 2017-09-06 DIAGNOSIS — R05 Cough: Secondary | ICD-10-CM | POA: Diagnosis present

## 2017-09-06 MED ORDER — HYDROCODONE-ACETAMINOPHEN 5-325 MG PO TABS
1.0000 | ORAL_TABLET | Freq: Once | ORAL | Status: AC
  Administered 2017-09-07: 1 via ORAL
  Filled 2017-09-06: qty 1

## 2017-09-06 MED ORDER — ALBUTEROL SULFATE HFA 108 (90 BASE) MCG/ACT IN AERS
2.0000 | INHALATION_SPRAY | Freq: Once | RESPIRATORY_TRACT | Status: AC
  Administered 2017-09-07: 2 via RESPIRATORY_TRACT
  Filled 2017-09-06: qty 6.7

## 2017-09-06 MED ORDER — AEROCHAMBER PLUS FLO-VU LARGE MISC
1.0000 | Freq: Once | Status: AC
  Administered 2017-09-07: 1

## 2017-09-06 MED ORDER — LEVOFLOXACIN 750 MG PO TABS: 750.0000 mg | ORAL_TABLET | Freq: Every day | ORAL | 0 refills | Status: AC

## 2017-09-06 MED ORDER — PREDNISONE 20 MG PO TABS
60.0000 mg | ORAL_TABLET | Freq: Once | ORAL | Status: AC
  Administered 2017-09-06: 60 mg via ORAL
  Filled 2017-09-06: qty 3

## 2017-09-06 MED ORDER — LEVOFLOXACIN 750 MG PO TABS
750.0000 mg | ORAL_TABLET | Freq: Every day | ORAL | Status: DC
  Administered 2017-09-07: 750 mg via ORAL
  Filled 2017-09-06: qty 1

## 2017-09-06 MED ORDER — HYDROCODONE-ACETAMINOPHEN 5-325 MG PO TABS: 1.0000 | ORAL_TABLET | Freq: Four times a day (QID) | ORAL | 0 refills | Status: DC | PRN

## 2017-09-06 MED ORDER — PREDNISONE 20 MG PO TABS: 60.0000 mg | ORAL_TABLET | Freq: Every day | ORAL | 0 refills | Status: AC

## 2017-09-06 MED ORDER — ALBUTEROL SULFATE (2.5 MG/3ML) 0.083% IN NEBU: 2.5000 mg | INHALATION_SOLUTION | RESPIRATORY_TRACT | 0 refills | Status: AC | PRN

## 2017-09-06 MED ORDER — IPRATROPIUM-ALBUTEROL 0.5-2.5 (3) MG/3ML IN SOLN
3.0000 mL | Freq: Once | RESPIRATORY_TRACT | Status: AC
  Administered 2017-09-06: 3 mL via RESPIRATORY_TRACT
  Filled 2017-09-06: qty 3

## 2017-09-06 NOTE — ED Provider Notes (Signed)
[facility]EMER[facility]Y DEPT Provider Note   CSN: [id number] Arrival date & time: 09/06/17  2152     History   Chief Complaint Chief Complaint  Patient presents with  . Cough    HPI Clinton Baldwin is a 66 y.o. male.  HPI   66 yo M with >30 pack year smoking history, AFib, CAD, RA here with right-sided chest wall pain, cough. Pt reports that over past 3-4 days he has had progressively worsening cough, wheezing, and sputum production. His sputum has gone from white to more thick, yellow. Two days ago, he was coughing in "a spell" when he felt acute onst of sharp, pulling right lateral chest wall pain. He has had persistent pain since then, worse with coughing, palpation. No alleviating factors. He's had no fever or chills. No nausea, vomiting. He had a coughing spell today so family advised him to present today. No history of PTX. No left-sided or substernal chest pain or chest pressure, no diaphoresis.  Past Medical History:  Diagnosis Date  . Atrial fibrillation ([place])    post op  . 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 Carpal tunnel syndrome   . Chronic pain    a. uses Marijuana for severe pain.  . Coronary artery disease    a. s/p STEMI 11/2009-->s/p CABG x 5: LIMA->LAD, VG->Diag, VG->OM2->OM3, VG->dRCA;  b. 05/23/2015 Inf STEMI/PCI: LM nl, LAD 65p/m, LCX 100, OM/2/3 small, RCA 70m, RPDA small, VG->dRCA 30p, VG->OM2->OM3 99 prox to OM2, 100 prox to OM3 (Rebel BMS's x 4), VG->Diag ok, LIMA->LAD ok.  . Dyslipidemia   . GERD (gastroesophageal reflux disease)   . History of pulmonary embolism    years ago  . Hypertension   . Leukocytosis    referred to hematology  . Morbid obesity ([place])   . Rheumatoid arthritis [facility])     Patient Active Problem List   Diagnosis Date Noted  . Hx of CABG x 5 2010 05/27/2015  . Rheumatic disease 05/27/2015  . STEMI inferior wall 05/23/15 05/23/2015  . Chest pain 01/31/2014  . Dyspnea 03/02/2012  . LEUKOCYTOSIS  11/15/2010  . EDEMA 10/01/2010  . LOOSE BODY IN KNEE 09/18/2010  . INTERNAL DERANGEMENT, LEFT KNEE 09/18/2010  . TINEA PEDIS 07/27/2010  . URINALYSIS, ABNORMAL 07/27/2010  . ANEMIA, MILD 06/13/2010  . KNEE PAIN, BILATERAL 06/11/2010  . LEG PAIN 06/11/2010  . Hyperlipidemia 03/27/2010  . Essential hypertension, benign 01/26/2010  . CAD S/P BMS to SVG-OM2-OM3 05/23/15 01/02/2010  . Atrial fibrillation ([place]) 01/02/2010  . TOBACCO USE, QUIT 01/02/2010  . [place] PRESENTING HAZARDS HEALTH 01/02/2010  . HYPERTENSION 12/25/2009  . GERD 12/25/2009    Past Surgical History:  Procedure Laterality Date  . CARDIAC CATHETERIZATION N/A 05/23/2015   Procedure: Left Heart Cath and Coronary Angiography;  Surgeon: Belva Crome, MD;  Location: [place] [facility];  Service: Cardiovascular;  Laterality: N/A;  . CARDIAC CATHETERIZATION N/A 05/23/2015   Procedure: Coronary Stent Intervention;  Surgeon: Belva Crome, MD;  Location: [place] [facility];  Service: Cardiovascular;  Laterality: N/A;  . CORONARY ARTERY BYPASS GRAFT  11/30/2009   x5. LIMA to LAD, SVG to diagonal branch of LAD, SVG sequentially to OM2 and 3, SVG to distal RCA with EVH from the right leg   . HEMORRHOID SURGERY         Home Medications    Prior to Admission medications   Medication Sig Start Date End Date Taking?  Authorizing Provider  acetaminophen (TYLENOL) 325 MG tablet Take 2 tablets (650 mg total) by mouth every 4 (four) hours as needed for headache or mild pain. 05/27/15   Erlene Quan, PA-C  albuterol (PROVENTIL) (2.5 MG/3ML) 0.083% nebulizer solution Take 3 mLs (2.5 mg total) by nebulization every 4 (four) hours as needed for wheezing or shortness of breath. 09/06/17   Duffy Bruce, MD  allopurinol (ZYLOPRIM) 100 MG tablet Take 100 mg by mouth daily.    [provider]  aspirin 81 MG chewable tablet Chew 1 tablet (81 mg total) by mouth daily. 05/27/15   Erlene Quan, PA-C  Calcium Carbonate-Vitamin  D 600-200 MG-UNIT TABS Take 1 tablet by mouth daily.    [provider]  Cholecalciferol (VITAMIN D-3) 1000 UNITS CAPS Take 1 capsule by mouth daily.    [provider]  clopidogrel (PLAVIX) 75 MG tablet Take 75 mg by mouth daily.    [provider]  etanercept (ENBREL) 50 MG/ML injection Inject 50 mg into the skin once a week.    [provider]  folic acid (FOLVITE) 1 MG tablet Take 1 mg by mouth daily.    [provider]  hydrochlorothiazide (MICROZIDE) 12.5 MG capsule Take 12.5 mg by mouth daily.    [provider]  HYDROcodone-acetaminophen (NORCO/VICODIN) 5-325 MG tablet Take 1-2 tablets by mouth every 6 (six) hours as needed for moderate pain or severe pain. 09/06/17   Duffy Bruce, MD  ibuprofen (ADVIL,MOTRIN) 600 MG tablet Take 1 tablet (600 mg total) by mouth every 6 (six) hours as needed. 11/05/15   Leonard Schwartz, MD  isosorbide mononitrate (IMDUR) 30 MG 24 hr tablet Take 1 tablet (30 mg total) by mouth daily. 01/31/14   Ghimire, Henreitta Leber, MD  levofloxacin (LEVAQUIN) 750 MG tablet Take 1 tablet (750 mg total) by mouth daily. 09/06/17 09/11/17  Duffy Bruce, MD  lisinopril (PRINIVIL,ZESTRIL) 40 MG tablet Take 40 mg by mouth daily.      [provider]  metoprolol (LOPRESSOR) 50 MG tablet Take 25 mg by mouth 2 (two) times daily.    [provider]  nitroGLYCERIN (NITROSTAT) 0.4 MG SL tablet Place 1 tablet (0.4 mg total) under the tongue every 5 (five) minutes as needed for chest pain. 06/07/15   Isaiah Serge, NP  pantoprazole (PROTONIX) 40 MG tablet Take 1 tablet (40 mg total) by mouth daily. 05/27/15   Erlene Quan, PA-C  pravastatin (PRAVACHOL) 20 MG tablet Take 20 mg by mouth daily.    [provider]  predniSONE (DELTASONE) 20 MG tablet Take 3 tablets (60 mg total) by mouth daily. 09/06/17 09/11/17  Duffy Bruce, MD  traMADol (ULTRAM) 50 MG tablet Take 50 mg by mouth every 6 (six) hours as needed.     [provider]    Family History Family History  Problem Relation Age of Onset  . Diabetes Mother   . Heart attack Brother 82  . Heart attack Brother 36    Social History Social History  Substance Use Topics  . Smoking status: Current Some Day Smoker  . Smokeless tobacco: Never Used  . Alcohol use No     Allergies   Patient has no known allergies.   Review of Systems Review of Systems  HENT: Positive for congestion and rhinorrhea.   Respiratory: Positive for cough and wheezing.   Cardiovascular: Positive for chest pain.  All other systems reviewed and are negative.    Physical Exam Updated Vital Signs  BP 131/87 (BP Location: Left Arm)   Pulse 75   Temp 98.1 F (36.7 C) (Oral)   Resp 18   SpO2 96%   Physical Exam  Constitutional: He is oriented to person, place, and time. He appears well-developed and well-nourished. No distress.  HENT:  Head: Normocephalic and atraumatic.  Mouth/Throat: Oropharynx is clear and moist.  Eyes: Conjunctivae are normal.  Neck: Neck supple.  Cardiovascular: Normal rate, regular rhythm and normal heart sounds.  Exam reveals no friction rub.   No murmur heard. Pulmonary/Chest: Effort normal. No respiratory distress. He has decreased breath sounds. He has wheezes in the right upper field, the right middle field, the right lower field, the left upper field, the left middle field and the left lower field. He has no rales.  Exquisite pinpoint TTP over right posterolateral intercostal and rib spaces. No deformity. No bruising. Breath sounds symmetric.  Abdominal: He exhibits no distension.  Musculoskeletal: He exhibits no edema.  Neurological: He is alert and oriented to person, place, and time. He exhibits normal muscle tone.  Skin: Skin is warm. Capillary refill takes less than 2 seconds.  Psychiatric: He has a normal mood and affect.  Nursing note and vitals reviewed.    ED Treatments / Results  Labs (all labs ordered  are listed, but only abnormal results are displayed) Labs Reviewed - No data to display  EKG  EKG Interpretation  Date/Time:  Saturday September 06 2017 22:13:19 EDT Ventricular Rate:  78 PR Interval:    QRS Duration: 86 QT Interval:  374 QTC Calculation: 426 R Axis:   17 Text Interpretation:  Sinus rhythm No significant change since last tracing Confirmed by Duffy Bruce [phone]) on 09/06/2017 10:24:27 PM       Radiology Dg Chest 2 View  Result Date: 09/06/2017 CLINICAL DATA:  Productive cough for 2 days. RIGHT chest pain. Smoker. EXAM: CHEST  2 VIEW COMPARISON:  Chest radiograph January 30, 2014 FINDINGS: The cardiac silhouette is similarly enlarged. Coronary artery stent. Status post median sternotomy for CABG. Calcified aortic knob. Similar mild chronic interstitial changes increased lung volumes and flattened hemidiaphragms. No pneumothorax. No pleural effusion or focal consolidation. Soft tissue planes and included osseous structures are nonsuspicious. IMPRESSION: Stable cardiomegaly.  COPD. Aortic Atherosclerosis (ICD10-I70.0). Electronically Signed   By: Elon Alas M.D.   On: 09/06/2017 23:15    Procedures Procedures (including critical care time)  Medications Ordered in ED Medications  ipratropium-albuterol (DUONEB) 0.5-2.5 (3) MG/3ML nebulizer solution 3 mL (3 mLs Nebulization Given 09/06/17 2229)  predniSONE (DELTASONE) tablet 60 mg (60 mg Oral Given 09/06/17 2229)  HYDROcodone-acetaminophen (NORCO/VICODIN) 5-325 MG per tablet 1 tablet (1 tablet Oral Given 09/07/17 0014)  albuterol (PROVENTIL HFA;VENTOLIN HFA) 108 (90 Base) MCG/ACT inhaler 2 puff (2 puffs Inhalation Given 09/07/17 0037)  AEROCHAMBER PLUS FLO-VU LARGE MISC 1 each (1 each Other Given 09/07/17 0037)     Initial Impression / Assessment and Plan / ED Course  I have reviewed the triage vital signs and the nursing notes.  Pertinent labs & imaging results that were available during my care of the  patient were reviewed by me and considered in my medical decision making (see chart for details).     66 yo M with PMHx chronic smoking here with cough, SOB, right-sided chest wall pain. Suspect underlying COPD with acute exacerbation versus viral bronchitis. Regarding his CP, it is sharp, reproducible on exam and likely 2/2 MSK chest wall pain from coughing. CXR shows no PTX or  PNA. No rib fx. No trauma. Pt denies any leg swelling and sx began after coughing, c/w rib sprain and I do not suspect PE. EKG non-ischemic, doubt ACS.   Pt given steroids, neb here with excellent improvement and resolution of wheezing. He is satting well on RA, speaking in full sentences, and would like to go home. Feel this is reasonable. Will d/c with outpt tx for COPD exacerbation, good return precautions.  Final Clinical Impressions(s) / ED Diagnoses   Final diagnoses:  Acute bronchitis due to other specified organisms  Rib pain    New Prescriptions Discharge Medication List as of 09/07/2017 12:13 AM    START taking these medications   Details  albuterol (PROVENTIL) (2.5 MG/3ML) 0.083% nebulizer solution Take 3 mLs (2.5 mg total) by nebulization every 4 (four) hours as needed for wheezing or shortness of breath., Starting Sat 09/06/2017, Print    levofloxacin (LEVAQUIN) 750 MG tablet Take 1 tablet (750 mg total) by mouth daily., Starting Sat 09/06/2017, Until Thu 09/11/2017, Print    predniSONE (DELTASONE) 20 MG tablet Take 3 tablets (60 mg total) by mouth daily., Starting Sat 09/06/2017, Until Thu 09/11/2017, Print         Duffy Bruce, MD 09/07/17 1104

## 2017-09-06 NOTE — ED Triage Notes (Signed)
Pt BIB [facility] for cough with milky white sputum production for 3 days. 2 days ago pt got a sharp pain on the right side of chest that hurts with inspiration, cough, and movement.

## 2017-09-07 NOTE — ED Notes (Signed)
Pharmacy called for aero chamber

## 2017-09-07 NOTE — Discharge Instructions (Signed)
-   Use your inhaler or nebulizer every 4 to 6 hours regularly for the next 2-3 days, then as needed

## 2018-08-04 ENCOUNTER — Emergency Department (HOSPITAL_COMMUNITY): Payer: Non-veteran care

## 2018-08-04 ENCOUNTER — Other Ambulatory Visit: Payer: Self-pay

## 2018-08-04 ENCOUNTER — Encounter (HOSPITAL_COMMUNITY): Payer: Self-pay | Admitting: *Deleted

## 2018-08-04 ENCOUNTER — Emergency Department (HOSPITAL_COMMUNITY)
Admission: EM | Admit: 2018-08-04 | Discharge: 2018-08-04 | Disposition: A | Discharge status: Home / Self Care | Payer: Non-veteran care | Attending: Emergency Medicine | Admitting: Emergency Medicine

## 2018-08-04 DIAGNOSIS — Z7982 Long term (current) use of aspirin: Secondary | ICD-10-CM | POA: Diagnosis not present

## 2018-08-04 DIAGNOSIS — I10 Essential (primary) hypertension: Secondary | ICD-10-CM | POA: Diagnosis not present

## 2018-08-04 DIAGNOSIS — F17210 Nicotine dependence, cigarettes, uncomplicated: Secondary | ICD-10-CM | POA: Insufficient documentation

## 2018-08-04 DIAGNOSIS — R220 Localized swelling, mass and lump, head: Secondary | ICD-10-CM | POA: Diagnosis not present

## 2018-08-04 DIAGNOSIS — R519 Headache, unspecified: Secondary | ICD-10-CM

## 2018-08-04 DIAGNOSIS — J329 Chronic sinusitis, unspecified: Secondary | ICD-10-CM | POA: Diagnosis not present

## 2018-08-04 DIAGNOSIS — Z79899 Other long term (current) drug therapy: Secondary | ICD-10-CM | POA: Insufficient documentation

## 2018-08-04 DIAGNOSIS — R51 Headache: Secondary | ICD-10-CM | POA: Insufficient documentation

## 2018-08-04 DIAGNOSIS — I2510 Atherosclerotic heart disease of native coronary artery without angina pectoris: Secondary | ICD-10-CM | POA: Insufficient documentation

## 2018-08-04 MED ORDER — AMOXICILLIN 500 MG PO CAPS: 500.0000 mg | ORAL_CAPSULE | Freq: Three times a day (TID) | ORAL | 0 refills | Status: DC

## 2018-08-04 NOTE — ED Notes (Signed)
Patient transported to CT 

## 2018-08-04 NOTE — ED Notes (Signed)
Pt stable, ambulatory, states understanding of discharge instructions 

## 2018-08-04 NOTE — Discharge Instructions (Addendum)
See your Physician for recheck in 1 week

## 2018-08-04 NOTE — ED Triage Notes (Signed)
Pt in c/o growth to the back of his head, behind his right ear, over the weekend the area started causing more pain and headaches, also c/o pain and numbness into his jaw and ear. Recommended by his PCP to come in for possible CT scan to evaluate area.

## 2018-08-04 NOTE — ED Provider Notes (Signed)
[place] EMERGENCY DEPARTMENT Provider Note   CSN: [id number] Arrival date & time: 08/04/18  1054     History   Chief Complaint Chief Complaint  Patient presents with  . Headache    HPI Clinton Baldwin is a 67 y.o. male.  The history is provided by the patient. No language interpreter was used.  Headache   This is a new problem. The current episode started more than 2 days ago. The problem occurs constantly. The problem has not changed since onset.The headache is associated with nothing. The pain is located in the occipital region. The pain is moderate. The pain does not radiate. Pertinent negatives include no anorexia. He has tried nothing for the symptoms. The treatment provided no relief.    Past Medical History:  Diagnosis Date  . Atrial fibrillation ([place])    post op  . 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 Carpal tunnel syndrome   . Chronic pain    a. uses Marijuana for severe pain.  . Coronary artery disease    a. s/p STEMI 11/2009-->s/p CABG x 5: LIMA->LAD, VG->Diag, VG->OM2->OM3, VG->dRCA;  b. 05/23/2015 Inf STEMI/PCI: LM nl, LAD 65p/m, LCX 100, OM/2/3 small, RCA 67m, RPDA small, VG->dRCA 30p, VG->OM2->OM3 99 prox to OM2, 100 prox to OM3 (Rebel BMS's x 4), VG->Diag ok, LIMA->LAD ok.  . Dyslipidemia   . GERD (gastroesophageal reflux disease)   . History of pulmonary embolism    years ago  . Hypertension   . Leukocytosis    referred to hematology  . Morbid obesity ([place])   . Rheumatoid arthritis [facility])     Patient Active Problem List   Diagnosis Date Noted  . Hx of CABG x 5 2010 05/27/2015  . Rheumatic disease 05/27/2015  . STEMI inferior wall 05/23/15 05/23/2015  . Chest pain 01/31/2014  . Dyspnea 03/02/2012  . LEUKOCYTOSIS 11/15/2010  . EDEMA 10/01/2010  . LOOSE BODY IN KNEE 09/18/2010  . INTERNAL DERANGEMENT, LEFT KNEE 09/18/2010  . TINEA PEDIS 07/27/2010  . URINALYSIS, ABNORMAL 07/27/2010  . ANEMIA,  MILD 06/13/2010  . KNEE PAIN, BILATERAL 06/11/2010  . LEG PAIN 06/11/2010  . Hyperlipidemia 03/27/2010  . Essential hypertension, benign 01/26/2010  . CAD S/P BMS to SVG-OM2-OM3 05/23/15 01/02/2010  . Atrial fibrillation ([place]) 01/02/2010  . TOBACCO USE, QUIT 01/02/2010  . [place] PRESENTING HAZARDS HEALTH 01/02/2010  . HYPERTENSION 12/25/2009  . GERD 12/25/2009    Past Surgical History:  Procedure Laterality Date  . CARDIAC CATHETERIZATION N/A 05/23/2015   Procedure: Left Heart Cath and Coronary Angiography;  Surgeon: Belva Crome, MD;  Location: [place] [facility];  Service: Cardiovascular;  Laterality: N/A;  . CARDIAC CATHETERIZATION N/A 05/23/2015   Procedure: Coronary Stent Intervention;  Surgeon: Belva Crome, MD;  Location: [place] [facility];  Service: Cardiovascular;  Laterality: N/A;  . CORONARY ARTERY BYPASS GRAFT  11/30/2009   x5. LIMA to LAD, SVG to diagonal branch of LAD, SVG sequentially to OM2 and 3, SVG to distal RCA with EVH from the right leg   . HEMORRHOID SURGERY          Home Medications    Prior to Admission medications   Medication Sig Start Date End Date Taking? Authorizing Provider  acetaminophen (TYLENOL) 325 MG tablet Take 2 tablets (650 mg total) by mouth every 4 (four) hours as needed for headache or mild pain. 05/27/15  Yes Erlene Quan, PA-C  albuterol (  PROVENTIL) (2.5 MG/3ML) 0.083% nebulizer solution Take 3 mLs (2.5 mg total) by nebulization every 4 (four) hours as needed for wheezing or shortness of breath. 09/06/17  Yes Duffy Bruce, MD  allopurinol (ZYLOPRIM) 100 MG tablet Take 100 mg by mouth daily.   Yes [provider]  aspirin 81 MG chewable tablet Chew 1 tablet (81 mg total) by mouth daily. 05/27/15  Yes Kilroy, Doreene Burke, PA-C  Calcium Carbonate-Vitamin D 600-200 MG-UNIT TABS Take 1 tablet by mouth daily.   Yes [provider]  Cholecalciferol (VITAMIN D-3) 1000 UNITS CAPS Take 1 capsule by mouth daily.   Yes  [provider]  clopidogrel (PLAVIX) 75 MG tablet Take 75 mg by mouth daily.   Yes [provider]  etanercept (ENBREL) 50 MG/ML injection Inject 50 mg into the skin once a week.   Yes [provider]  folic acid (FOLVITE) 1 MG tablet Take 1 mg by mouth daily.   Yes [provider]  hydrochlorothiazide (MICROZIDE) 12.5 MG capsule Take 12.5 mg by mouth daily.   Yes [provider]  HYDROcodone-acetaminophen (NORCO/VICODIN) 5-325 MG tablet Take 1-2 tablets by mouth every 6 (six) hours as needed for moderate pain or severe pain. 09/06/17  Yes Duffy Bruce, MD  isosorbide mononitrate (IMDUR) 30 MG 24 hr tablet Take 1 tablet (30 mg total) by mouth daily. 01/31/14  Yes Ghimire, Henreitta Leber, MD  lisinopril (PRINIVIL,ZESTRIL) 40 MG tablet Take 40 mg by mouth daily.     Yes [provider]  methotrexate (RHEUMATREX) 2.5 MG tablet Take 15 mg by mouth every 7 (seven) days.   Yes [provider]  metoprolol (LOPRESSOR) 50 MG tablet Take 25 mg by mouth 2 (two) times daily.   Yes [provider]  nitroGLYCERIN (NITROSTAT) 0.4 MG SL tablet Place 1 tablet (0.4 mg total) under the tongue every 5 (five) minutes as needed for chest pain. 06/07/15  Yes Isaiah Serge, NP  pantoprazole (PROTONIX) 40 MG tablet Take 1 tablet (40 mg total) by mouth daily. 05/27/15  Yes Kilroy, Luke K, PA-C  pravastatin (PRAVACHOL) 20 MG tablet Take 20 mg by mouth daily.   Yes [provider]    Family History Family History  Problem Relation Age of Onset  . Diabetes Mother   . Heart attack Brother 60  . Heart attack Brother 54    Social History Social History   Tobacco Use  . Smoking status: Current Some Day Smoker  . Smokeless tobacco: Never Used  Substance Use Topics  . Alcohol use: No    Alcohol/week: 0.0 standard drinks  . Drug use: Yes    Types: Marijuana    Comment: occasionally uses marijuana     Allergies   Patient has no known  allergies.   Review of Systems Review of Systems  Gastrointestinal: Negative for anorexia.  Neurological: Positive for headaches.  All other systems reviewed and are negative.    Physical Exam Updated Vital Signs BP 135/81 (BP Location: Right Arm)   Pulse 72   Temp 98.1 F (36.7 C) (Oral)   Resp 18   Ht 5\' 7"  (1.702 m)   Wt 111.6 kg   SpO2 99%   BMI 38.53 kg/m   Physical Exam  Constitutional: He is oriented to person, place, and time. He appears well-developed and well-nourished.  HENT:  Head: Normocephalic.  Mouth/Throat: Oropharynx is clear and moist.  Swelling right scalp tender to palpation,    Eyes: Pupils are equal, round, and  reactive to light. EOM are normal.  Neck: Normal range of motion.  Cardiovascular: Normal rate and normal heart sounds.  Pulmonary/Chest: Effort normal.  Abdominal: He exhibits no distension.  Musculoskeletal: Normal range of motion.  Neurological: He is alert and oriented to person, place, and time. He has normal strength.  Psychiatric: He has a normal mood and affect.  Nursing note and vitals reviewed.    ED Treatments / Results  Labs (all labs ordered are listed, but only abnormal results are displayed) Labs Reviewed - No data to display  EKG None  Radiology No results found.  Procedures Procedures (including critical care time)  Medications Ordered in ED Medications - No data to display   Initial Impression / Assessment and Plan / ED Course  I have reviewed the triage vital signs and the nursing notes.  Pertinent labs & imaging results that were available during my care of the patient were reviewed by me and considered in my medical decision making (see chart for details).     MDM  Ct head ordered/ ct shows sinusitis      Final Clinical Impressions(s) / ED Diagnoses   Final diagnoses:  Scalp mass  Bad headache  Sinusitis, unspecified chronicity, unspecified location    ED Discharge Orders         Ordered      amoxicillin (AMOXIL) 500 MG capsule  3 times daily     08/04/18 1528        An After Visit Summary was printed and given to the patient.    Fransico Meadow, [place] 08/04/18 [place], MD 08/04/18 [phone]

## 2020-10-04 ENCOUNTER — Other Ambulatory Visit: Payer: Self-pay | Admitting: Radiation Oncology

## 2020-10-04 ENCOUNTER — Ambulatory Visit
Admission: RE | Admit: 2020-10-04 | Discharge: 2020-10-04 | Disposition: A | Discharge status: Home / Self Care | Payer: Self-pay | Source: Ambulatory Visit | Attending: Radiation Oncology | Admitting: Radiation Oncology

## 2020-10-04 DIAGNOSIS — C3490 Malignant neoplasm of unspecified part of unspecified bronchus or lung: Secondary | ICD-10-CM

## 2020-10-08 NOTE — Progress Notes (Signed)
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 A diagnosis of Solitary pulmonary nodule was also pertinent to this visit.  Left upper lobe nodule consistent with primary bronchogenic carcinoma  HISTORY OF PRESENT ILLNESS::Clinton Baldwin is a 69 y.o. male who is seen as a courtesy of Dr. Janne Napoleon ([place]) for an opinion concerning radiation therapy as part of management for his recently diagnosed lung cancer. Today, he is accompanied by his daughter. The patient was undergoing repeat chest CT scans to follow-up on a left upper lobe lung nodule. Most recent chest CT scan on 09/01/2020 showed continued slow growth of the anterior left upper lobe nodule that was suspicious for lung cancer and measured 16 x 10 x 13 mm. There was no thoracic metastatic disease appreciated.   PET scan on 09/15/2020 showed hypermetabolic activity within the enlarging inferior left upper lobe nodule that was consistent with primary bronchogenic carcinoma until proven otherwise. There were no worrisome mediastinal or hilar lymph nodes, nor was there any definite evidence for metastatic disease within the neck, abdomen, or pelvis.  The patient has declined biopsy/surgery.  PREVIOUS RADIATION THERAPY: No  PAST MEDICAL HISTORY:  Past Medical History:  Diagnosis Date  . Atrial fibrillation ([place])    post op  . 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 Carpal tunnel syndrome   . Chronic pain    a. uses Marijuana for severe pain.  . Coronary artery disease    a. s/p STEMI 11/2009-->s/p CABG x 5: LIMA->LAD, VG->Diag, VG->OM2->OM3, VG->dRCA;  b. 05/23/2015 Inf  STEMI/PCI: LM nl, LAD 65p/m, LCX 100, OM/2/3 small, RCA 60m, RPDA small, VG->dRCA 30p, VG->OM2->OM3 99 prox to OM2, 100 prox to OM3 (Rebel BMS's x 4), VG->Diag ok, LIMA->LAD ok.  . Dyslipidemia   . GERD (gastroesophageal reflux disease)   . History of pulmonary embolism    years ago  . Hypertension   . Leukocytosis    referred to hematology  . Morbid obesity ([place])   . Rheumatoid arthritis ([place])     PAST SURGICAL HISTORY: Past Surgical History:  Procedure Laterality Date  . CARDIAC CATHETERIZATION N/A 05/23/2015   Procedure: Left Heart Cath and Coronary Angiography;  Surgeon: Belva Crome, MD;  Location: [facility];  Service: Cardiovascular;  Laterality: N/A;  . CARDIAC CATHETERIZATION N/A 05/23/2015   Procedure: Coronary Stent Intervention;  Surgeon: Belva Crome, MD;  Location: [facility];  Service: Cardiovascular;  Laterality: N/A;  . CORONARY ARTERY BYPASS GRAFT  11/30/2009   x5. LIMA to LAD, SVG to diagonal branch of LAD, SVG sequentially to OM2 and 3, SVG to distal RCA with EVH from the right leg   . HEMORRHOID SURGERY      FAMILY HISTORY:  Family History  Problem Relation Age of Onset  . Diabetes Mother   . Heart attack Brother 7        died Mar 29, 2020 of cancer  . Heart attack Brother 38    SOCIAL HISTORY:  Social History   Tobacco Use  . Smoking status: Current Some Day Smoker  . Smokeless tobacco: Never Used  Substance Use Topics  . Alcohol  use: No    Alcohol/week: 0.0 standard drinks  . Drug use: Yes    Types: Marijuana    Comment: occasionally uses marijuana    ALLERGIES: No Known Allergies  MEDICATIONS:  Current Outpatient Medications  Medication Sig Dispense Refill  . acetaminophen (TYLENOL) 325 MG tablet Take 2 tablets (650 mg total) by mouth every 4 (four) hours as needed for headache or mild pain.    Marland Kitchen albuterol (PROVENTIL) (2.5 MG/3ML) 0.083% nebulizer solution Take 3 mLs (2.5 mg total) by nebulization every 4 (four) hours as needed  for wheezing or shortness of breath. 75 mL 0  . allopurinol (ZYLOPRIM) 100 MG tablet Take 100 mg by mouth daily.    Marland Kitchen aspirin 81 MG chewable tablet Chew 1 tablet (81 mg total) by mouth daily.    . Calcium Carbonate-Vitamin D 600-200 MG-UNIT TABS Take 1 tablet by mouth daily.    . Cholecalciferol (VITAMIN D-3) 1000 UNITS CAPS Take 1 capsule by mouth daily.    . folic acid (FOLVITE) 1 MG tablet Take 1 mg by mouth daily.    . hydrochlorothiazide (MICROZIDE) 12.5 MG capsule Take 12.5 mg by mouth daily.    . isosorbide mononitrate (IMDUR) 30 MG 24 hr tablet Take 1 tablet (30 mg total) by mouth daily. 30 tablet 0  . lisinopril (PRINIVIL,ZESTRIL) 40 MG tablet Take 40 mg by mouth daily.      . methotrexate (RHEUMATREX) 2.5 MG tablet Take 15 mg by mouth every 7 (seven) days.    . metoprolol (LOPRESSOR) 50 MG tablet Take 25 mg by mouth 2 (two) times daily.    . nitroGLYCERIN (NITROSTAT) 0.4 MG SL tablet Place 1 tablet (0.4 mg total) under the tongue every 5 (five) minutes as needed for chest pain. 25 tablet 6  . pravastatin (PRAVACHOL) 20 MG tablet Take 20 mg by mouth daily.     No current facility-administered medications for this encounter.    REVIEW OF SYSTEMS:  A 10+ POINT REVIEW OF SYSTEMS WAS OBTAINED including neurology, dermatology, psychiatry, cardiac, respiratory, lymph, extremities, GI, GU, musculoskeletal, constitutional, reproductive, HEENT.  No reports of chest pain hemoptysis or cough.  Patient denies any significant breathing problems.  He is not on home oxygen.   PHYSICAL EXAM:  height is 5\' 7"  (1.702 m) and weight is 224 lb 6.4 oz (101.8 kg). His temperature is 97.8 F (36.6 C). His blood pressure is 119/61 and his pulse is 72. His respiration is 24 (abnormal) and oxygen saturation is 99%.   General: Alert and oriented, in no acute distress HEENT: Head is normocephalic. Extraocular movements are intact.  Neck: Neck is supple, no palpable cervical or supraclavicular  lymphadenopathy. Heart: Regular in rate and rhythm with no murmurs, rubs, or gallops. Chest: Clear to auscultation bilaterally, with no rhonchi, wheezes, or rales. Abdomen: Soft, nontender, nondistended, with no rigidity or guarding. Extremities: No cyanosis or edema. Lymphatics: see Neck Exam Skin: No concerning lesions. Musculoskeletal: symmetric strength and muscle tone throughout.  Some limitation of mobility of his right shoulder Neurologic: Cranial nerves II through XII are grossly intact. No obvious focalities. Speech is fluent. Coordination is intact. Psychiatric: Judgment and insight are intact. Affect is appropriate.   ECOG = 1  0 - Asymptomatic (Fully active, able to carry on all predisease activities without restriction)  1 - Symptomatic but completely ambulatory (Restricted in physically strenuous activity but ambulatory and able to carry out work of a light or sedentary nature. For example, light housework, office work)  2 -  Symptomatic, <50% in bed during the day (Ambulatory and capable of all self care but unable to carry out any work activities. Up and about more than 50% of waking hours)  3 - Symptomatic, >50% in bed, but not bedbound (Capable of only limited self-care, confined to bed or chair 50% or more of waking hours)  4 - Bedbound (Completely disabled. Cannot carry on any self-care. Totally confined to bed or chair)  5 - Death   Eustace Pen MM, Creech RH, Tormey DC, et al. [phone]). "Toxicity and response criteria of the [facility]". [place] Oncol. 5 (6): 649-55  LABORATORY DATA:  Lab Results  Component Value Date   WBC 13.5 (H) 05/24/2015   HGB 13.4 05/24/2015   HCT 40.8 05/24/2015   MCV 96.0 05/24/2015   PLT 190 05/24/2015   NEUTROABS 11.1 (H) 02/14/2011   Lab Results  Component Value Date   NA 139 06/07/2015   K 4.0 06/07/2015   CL 104 06/07/2015   CO2 26 06/07/2015   GLUCOSE 90 06/07/2015   CREATININE 1.43 06/07/2015    CALCIUM 9.6 06/07/2015      RADIOGRAPHY: No results found.    IMPRESSION: Left upper lobe nodule consistent with primary bronchogenic carcinoma  We discussed options for management of his solitary pulmonary nodule.  Given the patient's extensive cardiac history with surgery he does not wish to consider surgical management of this lesion.  We also discussed consideration for biopsy of this area but given the potential for pneumothorax he does not wish to consider this.  We discussed empiric treatment based on his PET positive nodule.  We discussed that this is in all likelihood is a non-small cell lung cancer, clinical stage I.  He would be an excellent candidate for stereotactic body radiation therapy based on the size of the lesion and location.  Today, I talked to the patient and daughter about the findings and work-up thus far.  We discussed the natural history of lung cancer, and general treatment, highlighting the role of radiotherapy (SBRT) in the management.  We discussed the available radiation techniques, and focused on the details of logistics and delivery.  We reviewed the anticipated acute and late sequelae associated with radiation in this setting.  The patient was encouraged to ask questions that I answered to the best of my ability.  A patient consent form was discussed and signed.  We retained a copy for our records.  The patient would like to proceed with radiation and will be scheduled for CT simulation.  PLAN: The patient will return tomorrow for simulation.  Treatments to begin next week.  Anticipate 3 SBRT treatments directed at his PET positive pulmonary nodule in the left upper lobe.  Total time spent in this encounter was 60 minutes which included reviewing the patient's most recent chest CT scans, PET scan, physical examination, and documentation.  ------------------------------------------------  Blair Promise, PhD, MD  This document serves as a record of services  personally performed by Gery Pray, MD. It was created on his behalf by Clerance Lav, a trained medical scribe. The creation of this record is based on the scribe's personal observations and the provider's statements to them. This document has been checked and approved by the attending provider.

## 2020-10-09 ENCOUNTER — Ambulatory Visit
Admission: RE | Admit: 2020-10-09 | Discharge: 2020-10-09 | Disposition: A | Discharge status: Home / Self Care | Payer: No Typology Code available for payment source | Source: Ambulatory Visit | Attending: Radiation Oncology | Admitting: Radiation Oncology

## 2020-10-09 ENCOUNTER — Encounter: Payer: Self-pay | Admitting: Radiation Oncology

## 2020-10-09 VITALS — BP 119/61 | HR 72 | Temp 97.8°F | Resp 24 | Ht 67.0 in | Wt 224.4 lb

## 2020-10-09 DIAGNOSIS — C3412 Malignant neoplasm of upper lobe, left bronchus or lung: Secondary | ICD-10-CM | POA: Insufficient documentation

## 2020-10-09 DIAGNOSIS — R911 Solitary pulmonary nodule: Secondary | ICD-10-CM | POA: Insufficient documentation

## 2020-10-09 DIAGNOSIS — F17200 Nicotine dependence, unspecified, uncomplicated: Secondary | ICD-10-CM | POA: Insufficient documentation

## 2020-10-09 NOTE — Progress Notes (Signed)
Error See other note dated 10/09/2020

## 2020-10-09 NOTE — Progress Notes (Signed)
Patient here for a consult with Dr. Sondra Come.  Thoracic Location of Tumor / Histology: Left upper lobe pulmonary nodule  Patient presented has had the nodule for a few years.  Biopsies:  none  Tobacco/Marijuana/Snuff/ETOH use: 1/2 PPD x 55 years, still smoking  Past/Anticipated interventions by cardiothoracic surgery: none  Past/Anticipated interventions by medical oncology, if any: no discussion  Signs/Symptoms  Weight changes, if any: down 25 lbs since wife died.  Respiratory complaints, if any: occ cough and occ shortness of breath  Hemoptysis, if any: no  Pain issues, if any: no    SAFETY ISSUES:  Prior radiation? no  Pacemaker/ICD? no  Possible current pregnancy? n/a  Is the patient on methotrexate? yes  BP 119/61 (BP Location: Right Arm, Patient Position: Sitting, Cuff Size: Large)   Pulse 72   Temp 97.8 F (36.6 C)   Resp (!) 24   Ht 5\' 7"  (1.702 m)   Wt 224 lb 6.4 oz (101.8 kg)   SpO2 99%   BMI 35.15 kg/m    Wt Readings from Last 3 Encounters:  10/09/20 224 lb 6.4 oz (101.8 kg)  08/04/18 246 lb (111.6 kg)  03/14/16 236 lb (107 kg)      Current Complaints / other details:  Here with granddaughter Benjamine Mola.

## 2020-10-10 ENCOUNTER — Encounter: Payer: Self-pay | Admitting: Licensed Clinical Social Worker

## 2020-10-10 NOTE — Progress Notes (Signed)
[facility] Psychosocial Distress Screening Clinical Social Work  Clinical Social Work was referred by distress screening protocol.  The patient scored a 8 on the Psychosocial Distress Thermometer which indicates severe distress. Clinical Social Worker contacted patient by phone to assess for distress and other psychosocial needs.   Patient reports that he is doing fairly well overall after visit yesterday. His wife died earlier this year, so he has some ups and downs but feels he is handling everything well. His daughter is a significant support for him.   CSW and patient discussed the importance of support during treatment.  CSW informed patient of support services at [facility].  CSW provided contact information and encouraged patient to call with any questions or concerns.  ONCBCN DISTRESS SCREENING 10/09/2020  Screening Type Initial Screening  Distress experienced in past week (1-10) 8  Referral to clinical social work Yes  Other wife died in 07-01-20, willing to have social worker call him.    Clinical Social Worker follow up needed: No.  If yes, follow up plan:  Tyronica [place], [place], LCSW

## 2020-10-11 ENCOUNTER — Telehealth: Payer: Self-pay | Admitting: *Deleted

## 2020-10-11 ENCOUNTER — Ambulatory Visit
Admission: RE | Admit: 2020-10-11 | Discharge: 2020-10-11 | Disposition: A | Discharge status: Home / Self Care | Payer: No Typology Code available for payment source | Source: Ambulatory Visit | Attending: Radiation Oncology | Admitting: Radiation Oncology

## 2020-10-11 DIAGNOSIS — F17210 Nicotine dependence, cigarettes, uncomplicated: Secondary | ICD-10-CM | POA: Diagnosis not present

## 2020-10-11 DIAGNOSIS — R911 Solitary pulmonary nodule: Secondary | ICD-10-CM

## 2020-10-11 DIAGNOSIS — C3412 Malignant neoplasm of upper lobe, left bronchus or lung: Secondary | ICD-10-CM | POA: Insufficient documentation

## 2020-10-11 NOTE — Telephone Encounter (Signed)
Faxed consult note requested by [facility] [phone]

## 2020-10-17 DIAGNOSIS — R911 Solitary pulmonary nodule: Secondary | ICD-10-CM | POA: Insufficient documentation

## 2020-10-24 ENCOUNTER — Ambulatory Visit
Admission: RE | Admit: 2020-10-24 | Discharge: 2020-10-24 | Disposition: A | Discharge status: Home / Self Care | Payer: No Typology Code available for payment source | Source: Ambulatory Visit | Attending: Radiation Oncology | Admitting: Radiation Oncology

## 2020-10-24 DIAGNOSIS — R911 Solitary pulmonary nodule: Secondary | ICD-10-CM | POA: Diagnosis not present

## 2020-10-25 ENCOUNTER — Ambulatory Visit: Payer: No Typology Code available for payment source | Admitting: Radiation Oncology

## 2020-10-26 ENCOUNTER — Ambulatory Visit
Admission: RE | Admit: 2020-10-26 | Payer: No Typology Code available for payment source | Source: Ambulatory Visit | Admitting: Radiation Oncology

## 2020-10-26 ENCOUNTER — Other Ambulatory Visit: Payer: Self-pay

## 2020-10-27 ENCOUNTER — Ambulatory Visit
Admission: RE | Admit: 2020-10-27 | Discharge: 2020-10-27 | Disposition: A | Discharge status: Home / Self Care | Payer: No Typology Code available for payment source | Source: Ambulatory Visit | Attending: Radiation Oncology | Admitting: Radiation Oncology

## 2020-10-27 DIAGNOSIS — R911 Solitary pulmonary nodule: Secondary | ICD-10-CM | POA: Diagnosis not present

## 2020-10-30 ENCOUNTER — Encounter: Payer: Self-pay | Admitting: Radiation Oncology

## 2020-10-30 ENCOUNTER — Ambulatory Visit
Admission: RE | Admit: 2020-10-30 | Discharge: 2020-10-30 | Disposition: A | Discharge status: Home / Self Care | Payer: No Typology Code available for payment source | Source: Ambulatory Visit | Attending: Radiation Oncology | Admitting: Radiation Oncology

## 2020-10-30 DIAGNOSIS — R911 Solitary pulmonary nodule: Secondary | ICD-10-CM | POA: Diagnosis not present

## 2020-11-30 ENCOUNTER — Other Ambulatory Visit: Payer: Self-pay

## 2020-11-30 ENCOUNTER — Ambulatory Visit
Admission: RE | Admit: 2020-11-30 | Discharge: 2020-11-30 | Disposition: A | Discharge status: Home / Self Care | Payer: No Typology Code available for payment source | Source: Ambulatory Visit | Attending: Radiation Oncology | Admitting: Radiation Oncology

## 2020-11-30 ENCOUNTER — Encounter: Payer: Self-pay | Admitting: Radiation Oncology

## 2020-11-30 DIAGNOSIS — Z923 Personal history of irradiation: Secondary | ICD-10-CM | POA: Diagnosis not present

## 2020-11-30 DIAGNOSIS — R911 Solitary pulmonary nodule: Secondary | ICD-10-CM

## 2020-11-30 DIAGNOSIS — Z7982 Long term (current) use of aspirin: Secondary | ICD-10-CM | POA: Insufficient documentation

## 2020-11-30 DIAGNOSIS — Z79899 Other long term (current) drug therapy: Secondary | ICD-10-CM | POA: Diagnosis not present

## 2020-11-30 NOTE — Progress Notes (Incomplete)
  Patient Name: Clinton Baldwin MRN: [id number] DOB: [birth year] Referring Physician: Dr. Carlean Jews (Profile Not Attached) Date of Service: 10/30/2020  [facility] [place]                                                        End Of Treatment Note  Diagnoses: R91.1-Solitary pulmonary nodule  Cancer Staging: Left upper lobe nodule consistent with primary bronchogenic carcinoma  Intent: Curative  Radiation Treatment Dates: 10/24/2020 through 10/30/2020 Site Technique Total Dose (Gy) Dose per Fx (Gy) Completed Fx Beam Energies  Lung, Left: Lung_Lt IMRT 54/54 18 3/3 6XFFF   Narrative: The patient tolerated radiation therapy quite well. He did not report any radiation-related side effects.  Plan: The patient will follow-up with radiation oncology in one month.  ________________________________________________   Blair Promise, PhD, MD  This document serves as a record of services personally performed by Gery Pray, MD. It was created on his behalf by Clerance Lav, a trained medical scribe. The creation of this record is based on the scribe's personal observations and the provider's statements to them. This document has been checked and approved by the attending provider.

## 2020-11-30 NOTE — Progress Notes (Signed)
Patient here for a 1 month f/u visit with Dr. Sondra Come. Reports shortness of breath with exertion. Patient denies pain or problems with appeitite.  BP (!) 117/55   Pulse 71   Temp (!) 97.5 F (36.4 C) (Temporal)   Resp 18   Ht 5\' 7"  (1.702 m)   Wt 233 lb 9.6 oz (106 kg)   SpO2 99%   BMI 36.59 kg/m   Wt Readings from Last 3 Encounters:  11/30/20 233 lb 9.6 oz (106 kg)  10/09/20 224 lb 6.4 oz (101.8 kg)  08/04/18 246 lb (111.6 kg)

## 2020-11-30 NOTE — Progress Notes (Signed)
Radiation Oncology         (336) [phone] ________________________________  Name: Clinton Baldwin MRN: [id number]  Date: 11/30/2020  DOB: 24-Sep-1951  Follow-Up Visit Note  CC: Alessandra Grout, MD  Carlean Jews, MD    ICD-10-CM   1. Solitary pulmonary nodule  R91.1     Diagnosis: Left upper lobe nodule consistent with primary bronchogenic carcinoma  Interval Since Last Radiation: One month and one day  Radiation Treatment Dates: 10/24/2020 through 10/30/2020 Site Technique Total Dose (Gy) Dose per Fx (Gy) Completed Fx Beam Energies  Lung, Left: Lung_Lt IMRT 54/54 18 3/3 6XFFF    Narrative:  The patient returns today for routine follow-up. No significant interval history since the end of treatment.   On review of systems, he reports no significant fatigue. he denies significant cough or hemoptysis.  He denies any pain within the chest area..                        ALLERGIES:  has No Known Allergies.  Meds: Current Outpatient Medications  Medication Sig Dispense Refill  . acetaminophen (TYLENOL) 325 MG tablet Take 2 tablets (650 mg total) by mouth every 4 (four) hours as needed for headache or mild pain.    Marland Kitchen albuterol (PROVENTIL) (2.5 MG/3ML) 0.083% nebulizer solution Take 3 mLs (2.5 mg total) by nebulization every 4 (four) hours as needed for wheezing or shortness of breath. 75 mL 0  . allopurinol (ZYLOPRIM) 100 MG tablet Take 100 mg by mouth daily.    Marland Kitchen aspirin 81 MG chewable tablet Chew 1 tablet (81 mg total) by mouth daily.    . Calcium Carbonate-Vitamin D 600-200 MG-UNIT TABS Take 1 tablet by mouth daily.    . Cholecalciferol (VITAMIN D-3) 1000 UNITS CAPS Take 1 capsule by mouth daily.    . folic acid (FOLVITE) 1 MG tablet Take 1 mg by mouth daily.    . hydrochlorothiazide (MICROZIDE) 12.5 MG capsule Take 12.5 mg by mouth daily.    . isosorbide mononitrate (IMDUR) 30 MG 24 hr tablet Take 1 tablet (30 mg total) by mouth daily. 30 tablet 0  . lisinopril  (PRINIVIL,ZESTRIL) 40 MG tablet Take 40 mg by mouth daily.      . methotrexate (RHEUMATREX) 2.5 MG tablet Take 15 mg by mouth every 7 (seven) days.    . metoprolol (LOPRESSOR) 50 MG tablet Take 25 mg by mouth 2 (two) times daily.    . Multiple Vitamins-Minerals (PRESERVISION AREDS 2+MULTI VIT) CAPS Take 1 tablet by mouth daily.    . nitroGLYCERIN (NITROSTAT) 0.4 MG SL tablet Place 1 tablet (0.4 mg total) under the tongue every 5 (five) minutes as needed for chest pain. 25 tablet 6  . pravastatin (PRAVACHOL) 20 MG tablet Take 20 mg by mouth daily.     No current facility-administered medications for this encounter.    Physical Findings: The patient is in no acute distress. Patient is alert and oriented.  height is 5\' 7"  (1.702 m) and weight is 233 lb 9.6 oz (106 kg). His temporal temperature is 97.5 F (36.4 C) (abnormal). His blood pressure is 117/55 (abnormal) and his pulse is 71. His respiration is 18 and oxygen saturation is 99%.   Lungs are clear to auscultation bilaterally. Heart has regular rate and rhythm. No palpable cervical, supraclavicular, or axillary adenopathy. Abdomen soft, non-tender, normal bowel sounds.  Remains in wheelchair for evaluation  Lab Findings: Lab Results  Component Value Date   WBC  13.5 (H) 05/24/2015   HGB 13.4 05/24/2015   HCT 40.8 05/24/2015   MCV 96.0 05/24/2015   PLT 190 05/24/2015    Radiographic Findings: No results found.  Impression: Left upper lobe nodule consistent with primary bronchogenic carcinoma  The patient tolerated his SBRT quite well without any side effects.  Plan: The patient wishes to return to the [place] system for follow-up.  He will be seeing Dr.Streer in January.  Recommend that the patient receive a repeat chest CT scan in approximately 3 months which will be 4 months after his radiation therapy.  Patient understands that he is free to follow-up in this clinic or if he has any questions we would be glad to talk with  him.   ____________________________________   Blair Promise, PhD, MD  This document serves as a record of services personally performed by Gery Pray, MD. It was created on his behalf by Clerance Lav, a trained medical scribe. The creation of this record is based on the scribe's personal observations and the provider's statements to them. This document has been checked and approved by the attending provider.

## 2021-08-10 ENCOUNTER — Other Ambulatory Visit: Payer: Self-pay

## 2021-08-10 NOTE — Patient Outreach (Signed)
Aging Gracefully Program  08/10/2021  FREDRICH CORY [birth year] [phone]  [facility] Evaluation Interviewer made contact with patient. Aging Gracefully survey declined at this time.  Interviewer will send referral back to [facility] for follow up.  [place] Management Assistant  [phone]

## 2021-12-07 ENCOUNTER — Other Ambulatory Visit: Payer: Self-pay

## 2021-12-07 ENCOUNTER — Ambulatory Visit
Admission: EM | Admit: 2021-12-07 | Discharge: 2021-12-07 | Disposition: A | Discharge status: Home / Self Care | Payer: No Typology Code available for payment source | Attending: Physician Assistant | Admitting: Physician Assistant

## 2021-12-07 ENCOUNTER — Encounter: Payer: Self-pay | Admitting: Emergency Medicine

## 2021-12-07 DIAGNOSIS — J441 Chronic obstructive pulmonary disease with (acute) exacerbation: Secondary | ICD-10-CM

## 2021-12-07 HISTORY — DX: Malignant neoplasm of unspecified part of unspecified bronchus or lung: C34.90

## 2021-12-07 HISTORY — DX: Malignant (primary) neoplasm, unspecified: C80.1

## 2021-12-07 MED ORDER — OSELTAMIVIR PHOSPHATE 75 MG PO CAPS: 75.0000 mg | ORAL_CAPSULE | Freq: Two times a day (BID) | ORAL | 0 refills | Status: DC

## 2021-12-07 MED ORDER — ONDANSETRON 4 MG PO TBDP: 4.0000 mg | ORAL_TABLET | Freq: Three times a day (TID) | ORAL | 0 refills | Status: AC | PRN

## 2021-12-07 MED ORDER — ONDANSETRON 4 MG PO TBDP: 4.0000 mg | ORAL_TABLET | Freq: Three times a day (TID) | ORAL | 0 refills | Status: DC | PRN

## 2021-12-07 MED ORDER — PREDNISONE 20 MG PO TABS: 40.0000 mg | ORAL_TABLET | Freq: Every day | ORAL | 0 refills | Status: AC

## 2021-12-07 MED ORDER — OSELTAMIVIR PHOSPHATE 75 MG PO CAPS: 75.0000 mg | ORAL_CAPSULE | Freq: Two times a day (BID) | ORAL | 0 refills | Status: AC

## 2021-12-07 MED ORDER — AZITHROMYCIN 250 MG PO TABS: 250.0000 mg | ORAL_TABLET | Freq: Every day | ORAL | 0 refills | Status: DC

## 2021-12-07 MED ORDER — PREDNISONE 20 MG PO TABS: 40.0000 mg | ORAL_TABLET | Freq: Every day | ORAL | 0 refills | Status: DC

## 2021-12-07 MED ORDER — AZITHROMYCIN 250 MG PO TABS: 250.0000 mg | ORAL_TABLET | Freq: Every day | ORAL | 0 refills | Status: AC

## 2021-12-07 NOTE — ED Triage Notes (Signed)
Cough, generalized body aches, vomiting x 3 days. Believes he has the flu

## 2021-12-07 NOTE — Discharge Instructions (Signed)
Please follow up in ED with any worsening symptoms.

## 2021-12-07 NOTE — ED Provider Notes (Signed)
[facility]    CSN: [id number] Arrival date & time: 12/07/21  1832      History   Chief Complaint Chief Complaint  Patient presents with   Cough   Generalized Body Aches    HPI LAYTHAN HAYTER is a 70 y.o. male.   Patient here today for evaluation of cough, body aches and vomiting he has had for 3 days.  He does have history of COPD.  He states that current symptoms feel flulike in nature.  The history is provided by the patient.  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 uses Marijuana for severe pain.   Coronary artery disease    a. s/p STEMI 11/2009-->s/p CABG x 5: LIMA->LAD, VG->Diag, VG->OM2->OM3, VG->dRCA;  b. 05/23/2015 Inf STEMI/PCI: LM nl, LAD 65p/m, LCX 100, OM/2/3 small, RCA 44m, RPDA small, VG->dRCA 30p, VG->OM2->OM3 99 prox to OM2, 100 prox to OM3 (Rebel BMS's x 4), VG->Diag ok, LIMA->LAD ok.   Dyslipidemia    GERD (gastroesophageal reflux disease)    History of pulmonary embolism    years ago   Hypertension    Leukocytosis    referred to hematology   Lung cancer [facility])    Morbid obesity ([place])    Rheumatoid arthritis [facility])     Patient Active Problem List   Diagnosis Date Noted   Solitary pulmonary nodule 10/09/2020   Hx of CABG x 5 2010 05/27/2015   Rheumatic disease 05/27/2015   STEMI inferior wall 05/23/15 05/23/2015   Chest pain 01/31/2014   Dyspnea 03/02/2012   LEUKOCYTOSIS 11/15/2010   EDEMA 10/01/2010   LOOSE BODY IN KNEE 09/18/2010   INTERNAL DERANGEMENT, LEFT KNEE 09/18/2010   TINEA PEDIS 07/27/2010   URINALYSIS, ABNORMAL 07/27/2010   ANEMIA, MILD 06/13/2010   KNEE PAIN, BILATERAL 06/11/2010   LEG PAIN  06/11/2010   Hyperlipidemia 03/27/2010   Essential hypertension, benign 01/26/2010   CAD S/P BMS to SVG-OM2-OM3 05/23/15 01/02/2010   Atrial fibrillation ([place]) 01/02/2010   TOBACCO USE, QUIT 01/02/2010   UNSPEC PERSONAL HX PRESENTING HAZARDS HEALTH 01/02/2010   HYPERTENSION 12/25/2009   GERD 12/25/2009    Past Surgical History:  Procedure Laterality Date   CARDIAC CATHETERIZATION N/A 05/23/2015   Procedure: Left Heart Cath and Coronary Angiography;  Surgeon: Belva Crome, MD;  Location: [place] CV LAB;  Service: Cardiovascular;  Laterality: N/A;   CARDIAC CATHETERIZATION N/A 05/23/2015   Procedure: Coronary Stent Intervention;  Surgeon: Belva Crome, MD;  Location: [place] CV LAB;  Service: Cardiovascular;  Laterality: N/A;   CORONARY ARTERY BYPASS GRAFT  11/30/2009   x5. LIMA to LAD, SVG to diagonal branch of LAD, SVG sequentially to OM2 and 3, SVG to distal RCA with EVH from the right leg    HEMORRHOID SURGERY         Home Medications    Prior to Admission medications   Medication Sig Start Date End Date Taking? Authorizing Provider  acetaminophen (TYLENOL) 325 MG tablet Take 2 tablets (650 mg total) by mouth every 4 (four) hours as needed for headache or mild pain. 05/27/15  Kilroy, Luke K, PA-C  albuterol (PROVENTIL) (2.5 MG/3ML) 0.083% nebulizer solution Take 3 mLs (2.5 mg total) by nebulization every 4 (four) hours as needed for wheezing or shortness of breath. 09/06/17   Duffy Bruce, MD  allopurinol (ZYLOPRIM) 100 MG tablet Take 100 mg by mouth daily.    [provider]  aspirin 81 MG chewable tablet Chew 1 tablet (81 mg total) by mouth daily. 05/27/15   Erlene Quan, PA-C  azithromycin (ZITHROMAX) 250 MG tablet Take 1 tablet (250 mg total) by mouth daily. Take first 2 tablets together, then 1 every day until finished. 12/07/21   Francene Finders, PA-C  Calcium Carbonate-Vitamin D 600-200 MG-UNIT TABS Take 1 tablet by mouth daily.    [provider]   Cholecalciferol (VITAMIN D-3) 1000 UNITS CAPS Take 1 capsule by mouth daily.    [provider]  folic acid (FOLVITE) 1 MG tablet Take 1 mg by mouth daily.    [provider]  hydrochlorothiazide (MICROZIDE) 12.5 MG capsule Take 12.5 mg by mouth daily.    [provider]  isosorbide mononitrate (IMDUR) 30 MG 24 hr tablet Take 1 tablet (30 mg total) by mouth daily. 01/31/14   Ghimire, Henreitta Leber, MD  lisinopril (PRINIVIL,ZESTRIL) 40 MG tablet Take 40 mg by mouth daily.      [provider]  methotrexate (RHEUMATREX) 2.5 MG tablet Take 15 mg by mouth every 7 (seven) days.    [provider]  metoprolol (LOPRESSOR) 50 MG tablet Take 25 mg by mouth 2 (two) times daily.    [provider]  Multiple Vitamins-Minerals (PRESERVISION AREDS 2+MULTI VIT) CAPS Take 1 tablet by mouth daily.    [provider]  nitroGLYCERIN (NITROSTAT) 0.4 MG SL tablet Place 1 tablet (0.4 mg total) under the tongue every 5 (five) minutes as needed for chest pain. 06/07/15   Isaiah Serge, NP  ondansetron (ZOFRAN-ODT) 4 MG disintegrating tablet Take 1 tablet (4 mg total) by mouth every 8 (eight) hours as needed. 12/07/21   Francene Finders, PA-C  oseltamivir (TAMIFLU) 75 MG capsule Take 1 capsule (75 mg total) by mouth every 12 (twelve) hours. 12/07/21   Francene Finders, PA-C  pravastatin (PRAVACHOL) 20 MG tablet Take 20 mg by mouth daily.    [provider]  predniSONE (DELTASONE) 20 MG tablet Take 2 tablets (40 mg total) by mouth daily with breakfast for 5 days. 12/07/21 12/12/21  Francene Finders, PA-C    Family History Family History  Problem Relation Age of Onset   Diabetes Mother    Heart attack Brother 66        died 04/21/2020 of cancer   Heart attack Brother 86    Social History Social History   Tobacco Use   Smoking status: Some Days   Smokeless tobacco: Never  Substance Use Topics   Alcohol use: No    Alcohol/week: 0.0 standard  drinks   Drug use: Yes    Types: Marijuana    Comment: occasionally uses marijuana     Allergies   Patient has no known allergies.   Review of Systems Review of Systems  Constitutional:  Positive for chills. Negative for fever.  HENT:  Positive for congestion. Negative for ear pain and sore throat.   Eyes:  Negative for discharge and redness.  Respiratory:  Positive for cough and shortness of breath.   Gastrointestinal:  Positive for nausea and vomiting. Negative for abdominal pain.  Musculoskeletal:  Positive  for myalgias.    Physical Exam Triage Vital Signs ED Triage Vitals [12/07/21 1902]  Enc Vitals Group     BP 116/79     Pulse Rate 73     Resp (!) 24     Temp 97.6 F (36.4 C)     Temp Source Oral     SpO2 95 %     Weight      Height      Head Circumference      Peak Flow      Pain Score      Pain Loc      Pain Edu?      Excl. in [place]?    No data found.  Updated Vital Signs BP 116/79 (BP Location: Left Arm)    Pulse 73    Temp 97.6 F (36.4 C) (Oral)    Resp (!) 24    SpO2 95%      Physical Exam Vitals and nursing note reviewed.  Constitutional:      General: He is not in acute distress.    Appearance: He is well-developed. He is not ill-appearing.  HENT:     Head: Normocephalic and atraumatic.     Nose: Congestion present.     Mouth/Throat:     Mouth: Mucous membranes are moist.     Pharynx: No posterior oropharyngeal erythema.  Eyes:     Conjunctiva/sclera: Conjunctivae normal.  Cardiovascular:     Rate and Rhythm: Normal rate and regular rhythm.     Heart sounds: Normal heart sounds. No murmur heard. Pulmonary:     Effort: Pulmonary effort is normal. No respiratory distress.     Breath sounds: No rhonchi or rales.     Comments: Breath sounds diminished throughout Skin:    General: Skin is warm and dry.  Neurological:     Mental Status: He is alert.  Psychiatric:        Mood and Affect: Mood normal.        Behavior: Behavior normal.      UC Treatments / Results  Labs (all labs ordered are listed, but only abnormal results are displayed) Labs Reviewed  COVID-19, FLU A+B NAA    EKG   Radiology No results found.  Procedures Procedures (including critical care time)  Medications Ordered in UC Medications - No data to display  Initial Impression / Assessment and Plan / UC Course  I have reviewed the triage vital signs and the nursing notes.  Pertinent labs & imaging results that were available during my care of the patient were reviewed by me and considered in my medical decision making (see chart for details).    Suspect likely COPD exacerbation.  Will treat with steroids as well as Z-Pak.  Symptoms seem to be consistent with flu which is likely causing the exacerbation so we will also send in Tamiflu.  COVID and flu screening ordered.  Patient request medication for nausea, Zofran prescribed.  Recommend follow-up in the ED with any worsening symptoms given chronic co-morbidities.  Patient expresses understanding.  Final Clinical Impressions(s) / UC Diagnoses   Final diagnoses:  COPD exacerbation [facility])     Discharge Instructions      Please follow up in ED with any worsening symptoms.     ED Prescriptions     Medication Sig Dispense Auth. Provider   predniSONE (DELTASONE) 20 MG tablet  (Status: Discontinued) Take 2 tablets (40 mg total) by mouth daily with breakfast for 5 days. 10 tablet Doyle Askew,  Sallee Lange, PA-C   oseltamivir (TAMIFLU) 75 MG capsule  (Status: Discontinued) Take 1 capsule (75 mg total) by mouth every 12 (twelve) hours. 10 capsule Francene Finders, PA-C   azithromycin (ZITHROMAX) 250 MG tablet  (Status: Discontinued) Take 1 tablet (250 mg total) by mouth daily. Take first 2 tablets together, then 1 every day until finished. 6 tablet Ewell Poe F, PA-C   ondansetron (ZOFRAN-ODT) 4 MG disintegrating tablet  (Status: Discontinued) Take 1 tablet (4 mg total) by mouth every 8 (eight)  hours as needed. 20 tablet Ewell Poe F, PA-C   azithromycin (ZITHROMAX) 250 MG tablet Take 1 tablet (250 mg total) by mouth daily. Take first 2 tablets together, then 1 every day until finished. 6 tablet Ewell Poe F, PA-C   ondansetron (ZOFRAN-ODT) 4 MG disintegrating tablet Take 1 tablet (4 mg total) by mouth every 8 (eight) hours as needed. 20 tablet Ewell Poe F, PA-C   oseltamivir (TAMIFLU) 75 MG capsule Take 1 capsule (75 mg total) by mouth every 12 (twelve) hours. 10 capsule Francene Finders, PA-C   predniSONE (DELTASONE) 20 MG tablet Take 2 tablets (40 mg total) by mouth daily with breakfast for 5 days. 10 tablet Francene Finders, PA-C      PDMP not reviewed this encounter.   Francene Finders, PA-C 12/07/21 1924

## 2021-12-08 LAB — COVID-19, FLU A+B NAA
Influenza A, NAA: NOT DETECTED
Influenza B, NAA: NOT DETECTED
SARS-CoV-2, NAA: NOT DETECTED

## 2021-12-09 ENCOUNTER — Other Ambulatory Visit: Payer: Self-pay

## 2021-12-09 ENCOUNTER — Emergency Department (HOSPITAL_COMMUNITY)
Admission: EM | Admit: 2021-12-09 | Discharge: 2021-12-10 | Disposition: A | Discharge status: Other Acute Inpatient Hospital | Payer: No Typology Code available for payment source | Attending: Emergency Medicine | Admitting: Emergency Medicine

## 2021-12-09 DIAGNOSIS — M545 Low back pain, unspecified: Secondary | ICD-10-CM | POA: Insufficient documentation

## 2021-12-09 DIAGNOSIS — Z20822 Contact with and (suspected) exposure to covid-19: Secondary | ICD-10-CM | POA: Diagnosis not present

## 2021-12-09 DIAGNOSIS — R0602 Shortness of breath: Secondary | ICD-10-CM | POA: Insufficient documentation

## 2021-12-09 DIAGNOSIS — Z79899 Other long term (current) drug therapy: Secondary | ICD-10-CM | POA: Diagnosis not present

## 2021-12-09 DIAGNOSIS — F17200 Nicotine dependence, unspecified, uncomplicated: Secondary | ICD-10-CM | POA: Insufficient documentation

## 2021-12-09 DIAGNOSIS — Z85118 Personal history of other malignant neoplasm of bronchus and lung: Secondary | ICD-10-CM | POA: Insufficient documentation

## 2021-12-09 DIAGNOSIS — Z951 Presence of aortocoronary bypass graft: Secondary | ICD-10-CM | POA: Diagnosis not present

## 2021-12-09 DIAGNOSIS — R079 Chest pain, unspecified: Secondary | ICD-10-CM | POA: Diagnosis not present

## 2021-12-09 DIAGNOSIS — D72829 Elevated white blood cell count, unspecified: Secondary | ICD-10-CM | POA: Diagnosis not present

## 2021-12-09 DIAGNOSIS — Z7982 Long term (current) use of aspirin: Secondary | ICD-10-CM | POA: Diagnosis not present

## 2021-12-09 DIAGNOSIS — I2510 Atherosclerotic heart disease of native coronary artery without angina pectoris: Secondary | ICD-10-CM | POA: Insufficient documentation

## 2021-12-09 DIAGNOSIS — I10 Essential (primary) hypertension: Secondary | ICD-10-CM | POA: Diagnosis not present

## 2021-12-09 LAB — I-STAT CHEM 8, ED
BUN: 69 mg/dL — ABNORMAL HIGH (ref 8–23)
Calcium, Ion: 1.19 mmol/L (ref 1.15–1.40)
Chloride: 105 mmol/L (ref 98–111)
Creatinine, Ser: 3.2 mg/dL — ABNORMAL HIGH (ref 0.61–1.24)
Glucose, Bld: 152 mg/dL — ABNORMAL HIGH (ref 70–99)
HCT: 50 % (ref 39.0–52.0)
Hemoglobin: 17 g/dL (ref 13.0–17.0)
Potassium: 3.5 mmol/L (ref 3.5–5.1)
Sodium: 141 mmol/L (ref 135–145)
TCO2: 27 mmol/L (ref 22–32)

## 2021-12-09 MED ORDER — HYDROMORPHONE HCL 1 MG/ML IJ SOLN
1.0000 mg | Freq: Once | INTRAMUSCULAR | Status: AC
  Administered 2021-12-09: 1 mg via INTRAVENOUS
  Filled 2021-12-09: qty 1

## 2021-12-09 MED ORDER — ONDANSETRON HCL 4 MG/2ML IJ SOLN
4.0000 mg | Freq: Once | INTRAMUSCULAR | Status: AC
  Administered 2021-12-09: 4 mg via INTRAVENOUS
  Filled 2021-12-09: qty 2

## 2021-12-09 NOTE — ED Triage Notes (Signed)
Pt brought to ED by [facility] via stretcher direct bedded to room 18 with c/o severe lower back pain, chest pain and ShOB related to severe back pain. Pt appears diaphoretic at time of arrival and WOB noted. Pt denies any nausea or vomiting.

## 2021-12-10 ENCOUNTER — Emergency Department (HOSPITAL_COMMUNITY): Payer: No Typology Code available for payment source

## 2021-12-10 ENCOUNTER — Encounter (HOSPITAL_COMMUNITY): Payer: Self-pay

## 2021-12-10 LAB — LACTIC ACID, PLASMA
Lactic Acid, Venous: 2.6 mmol/L (ref 0.5–1.9)
Lactic Acid, Venous: 3.2 mmol/L (ref 0.5–1.9)

## 2021-12-10 LAB — CBC WITH DIFFERENTIAL/PLATELET
Abs Immature Granulocytes: 0 10*3/uL (ref 0.00–0.07)
Band Neutrophils: 2 %
Basophils Absolute: 0 10*3/uL (ref 0.0–0.1)
Basophils Relative: 0 %
Eosinophils Absolute: 0.9 10*3/uL — ABNORMAL HIGH (ref 0.0–0.5)
Eosinophils Relative: 1 %
HCT: 45.8 % (ref 39.0–52.0)
Hemoglobin: 14.9 g/dL (ref 13.0–17.0)
Lymphocytes Relative: 10 %
Lymphs Abs: 9 10*3/uL — ABNORMAL HIGH (ref 0.7–4.0)
MCH: 31.4 pg (ref 26.0–34.0)
MCHC: 32.5 g/dL (ref 30.0–36.0)
MCV: 96.4 fL (ref 80.0–100.0)
Monocytes Absolute: 21.7 10*3/uL — ABNORMAL HIGH (ref 0.1–1.0)
Monocytes Relative: 24 %
Neutro Abs: 10.8 10*3/uL — ABNORMAL HIGH (ref 1.7–7.7)
Neutrophils Relative %: 10 %
Other: 53 %
Platelets: 63 10*3/uL — ABNORMAL LOW (ref 150–400)
RBC: 4.75 MIL/uL (ref 4.22–5.81)
RDW: 15.3 % (ref 11.5–15.5)
Smear Review: NORMAL
WBC: 90.3 10*3/uL (ref 4.0–10.5)
nRBC: 0.2 % (ref 0.0–0.2)

## 2021-12-10 LAB — COMPREHENSIVE METABOLIC PANEL
ALT: 24 U/L (ref 0–44)
AST: 34 U/L (ref 15–41)
Albumin: 2.8 g/dL — ABNORMAL LOW (ref 3.5–5.0)
Alkaline Phosphatase: 133 U/L — ABNORMAL HIGH (ref 38–126)
Anion gap: 12 (ref 5–15)
BUN: 62 mg/dL — ABNORMAL HIGH (ref 8–23)
CO2: 25 mmol/L (ref 22–32)
Calcium: 9.1 mg/dL (ref 8.9–10.3)
Chloride: 103 mmol/L (ref 98–111)
Creatinine, Ser: 3.43 mg/dL — ABNORMAL HIGH (ref 0.61–1.24)
GFR, Estimated: 18 mL/min — ABNORMAL LOW (ref >60)
Glucose, Bld: 165 mg/dL — ABNORMAL HIGH (ref 70–99)
Potassium: 3.2 mmol/L — ABNORMAL LOW (ref 3.5–5.1)
Sodium: 140 mmol/L (ref 135–145)
Total Bilirubin: 0.3 mg/dL (ref 0.3–1.2)
Total Protein: 6.2 g/dL — ABNORMAL LOW (ref 6.5–8.1)

## 2021-12-10 LAB — TROPONIN I (HIGH SENSITIVITY)
Troponin I (High Sensitivity): 387 ng/L (ref <18)
Troponin I (High Sensitivity): 359 ng/L (ref <18)

## 2021-12-10 LAB — DIC (DISSEMINATED INTRAVASCULAR COAGULATION)PANEL
D-Dimer, Quant: >20 ug/mL-FEU — ABNORMAL HIGH (ref 0.00–0.50)
Fibrinogen: 355 mg/dL (ref 210–475)
INR: 1.5 — ABNORMAL HIGH (ref 0.8–1.2)
Platelets: 53 10*3/uL — ABNORMAL LOW (ref 150–400)
Prothrombin Time: 18.4 seconds — ABNORMAL HIGH (ref 11.4–15.2)
Smear Review: NONE SEEN
aPTT: 28 seconds (ref 24–36)

## 2021-12-10 LAB — BRAIN NATRIURETIC PEPTIDE: B Natriuretic Peptide: 147.2 pg/mL — ABNORMAL HIGH (ref 0.0–100.0)

## 2021-12-10 LAB — LIPASE, BLOOD: Lipase: 39 U/L (ref 11–51)

## 2021-12-10 LAB — RESP PANEL BY RT-PCR (FLU A&B, COVID) ARPGX2
Influenza A by PCR: NEGATIVE
Influenza B by PCR: NEGATIVE
SARS Coronavirus 2 by RT PCR: NEGATIVE

## 2021-12-10 LAB — LACTATE DEHYDROGENASE: LDH: 1159 U/L — ABNORMAL HIGH (ref 98–192)

## 2021-12-10 LAB — CK: Total CK: 244 U/L (ref 49–397)

## 2021-12-10 LAB — URIC ACID: Uric Acid, Serum: 13.3 mg/dL — ABNORMAL HIGH (ref 3.7–8.6)

## 2021-12-10 MED ORDER — LACTATED RINGERS IV BOLUS (SEPSIS)
1000.0000 mL | Freq: Once | INTRAVENOUS | Status: AC
  Administered 2021-12-10: 02:00:00 1000 mL via INTRAVENOUS

## 2021-12-10 MED ORDER — SODIUM CHLORIDE 0.9 % IV SOLN
2.0000 g | Freq: Once | INTRAVENOUS | Status: AC
  Administered 2021-12-10: 02:00:00 2 g via INTRAVENOUS
  Filled 2021-12-10: qty 2

## 2021-12-10 MED ORDER — VANCOMYCIN HCL 2000 MG/400ML IV SOLN
2000.0000 mg | Freq: Once | INTRAVENOUS | Status: AC
  Administered 2021-12-10: 03:00:00 2000 mg via INTRAVENOUS
  Filled 2021-12-10: qty 400

## 2021-12-10 MED ORDER — IOPAMIDOL (ISOVUE-370) INJECTION 76%
100.0000 mL | Freq: Once | INTRAVENOUS | Status: AC | PRN
  Administered 2021-12-10: 01:00:00 100 mL via INTRAVENOUS

## 2021-12-10 MED ORDER — HYDROMORPHONE HCL 1 MG/ML IJ SOLN
1.0000 mg | Freq: Once | INTRAMUSCULAR | Status: AC
  Administered 2021-12-10: 03:00:00 1 mg via INTRAVENOUS
  Filled 2021-12-10: qty 1

## 2021-12-10 MED ORDER — LACTATED RINGERS IV BOLUS (SEPSIS)
500.0000 mL | Freq: Once | INTRAVENOUS | Status: AC
  Administered 2021-12-10: 03:00:00 500 mL via INTRAVENOUS

## 2021-12-10 MED ORDER — METRONIDAZOLE 500 MG/100ML IV SOLN
500.0000 mg | Freq: Once | INTRAVENOUS | Status: AC
  Administered 2021-12-10: 02:00:00 500 mg via INTRAVENOUS
  Filled 2021-12-10: qty 100

## 2021-12-10 MED ORDER — LACTATED RINGERS IV SOLN: INTRAVENOUS | Status: DC

## 2021-12-10 MED ORDER — LACTATED RINGERS IV BOLUS
1000.0000 mL | Freq: Once | INTRAVENOUS | Status: AC
  Administered 2021-12-10: 06:00:00 1000 mL via INTRAVENOUS

## 2021-12-10 MED ORDER — LABETALOL HCL 5 MG/ML IV SOLN
10.0000 mg | Freq: Once | INTRAVENOUS | Status: AC
  Administered 2021-12-10: 06:00:00 10 mg via INTRAVENOUS
  Filled 2021-12-10: qty 4

## 2021-12-10 MED ORDER — HEPARIN (PORCINE) 25000 UT/250ML-% IV SOLN
1200.0000 [IU]/h | INTRAVENOUS | Status: DC
  Administered 2021-12-10: 04:00:00 1200 [IU]/h via INTRAVENOUS
  Filled 2021-12-10: qty 250

## 2021-12-10 MED ORDER — HEPARIN BOLUS VIA INFUSION
4000.0000 [IU] | Freq: Once | INTRAVENOUS | Status: AC
  Administered 2021-12-10: 04:00:00 4000 [IU] via INTRAVENOUS
  Filled 2021-12-10: qty 4000

## 2021-12-10 NOTE — Sepsis Progress Note (Signed)
[facility] following Code Sepsis.  Notified provider of need to order repeat lactic acid.

## 2021-12-10 NOTE — ED Notes (Signed)
[facility] transport called

## 2021-12-10 NOTE — Progress Notes (Signed)
ANTICOAGULATION CONSULT NOTE - Initial Consult  Pharmacy Consult for Heparin Indication: chest pain/ACS  No Known Allergies  Patient Measurements: Height: 5\' 7"  (170.2 cm) Weight: 105.2 kg (232 lb) IBW/kg (Calculated) : 66.1 Heparin Dosing Weight: 90 kg  Vital Signs: Temp: 97.8 F (36.6 C) (12/25 2329) Temp Source: Oral (12/25 2329) BP: 168/87 (12/26 0128) Pulse Rate: 91 (12/26 0128)  Labs: Recent Labs    12/09/21 2325 12/09/21 2352  HGB 14.9 17.0  HCT 45.8 50.0  PLT 63*  --   CREATININE 3.43* 3.20*  TROPONINIHS 387*  --     Estimated Creatinine Clearance: 24.1 mL/min (A) (by C-G formula based on SCr of 3.2 mg/dL (H)).   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 uses Marijuana for severe pain.   Coronary artery disease    a. s/p STEMI 11/2009-->s/p CABG x 5: LIMA->LAD, VG->Diag, VG->OM2->OM3, VG->dRCA;  b. 05/23/2015 Inf STEMI/PCI: LM nl, LAD 65p/m, LCX 100, OM/2/3 small, RCA 63m, RPDA small, VG->dRCA 30p, VG->OM2->OM3 99 prox to OM2, 100 prox to OM3 (Rebel BMS's x 4), VG->Diag ok, LIMA->LAD ok.   Dyslipidemia    GERD (gastroesophageal reflux disease)    History of pulmonary embolism    years ago   Hypertension    Leukocytosis    referred to hematology   Lung cancer [facility])    Morbid obesity ([facility])    Rheumatoid arthritis (H[facility])     Medications:  See EMR  Assessment: 70 y.o. M presents with cough/generalized body aches. Troponin up to 387. Starting heparin for r/o ACS. Plt down to 63 - MD aware. Hgb/Hct ok.   Goal of Therapy:  Heparin level 0.3-0.7 units/ml Monitor platelets by anticoagulation protocol: Yes   Plan:  Heparin IV bolus 4000 units Heparin gtt at 1200 units/hr Will f/u heparin level and CBC in 8 hours Daily heparin level and CBC  F/u platelets  closely  Sherlon Handing, PharmD, BCPS Please see amion for complete clinical pharmacist phone list 12/10/2021,2:49 AM

## 2021-12-10 NOTE — ED Provider Notes (Signed)
[facility] EMERGENCY DEPARTMENT Provider Note   CSN: [id number] Arrival date & time: 12/09/21  2320     History Chief Complaint  Patient presents with   Chest Pain   Shortness of Breath   Back Pain    Clinton Baldwin is a 70 y.o. male.  Patient presents to the emergency department for evaluation of chest pain and back pain.  Patient has been experiencing intermittent low back pain for a couple of weeks.  Pain is across the lumbar area, waxes and wanes.  It is quite severe at times.  Tonight it is very severe.  He comes to the ER by ambulance.  EMS report that he has been diaphoretic and ill-appearing during transport.  Blood pressure dropped after fentanyl but he did respond to some fluids.  Patient did indicate mild to moderate chest pain upon arrival.  He is short of breath.  Was seen at the [place] 1-1/2 weeks ago for the back pain.  He was told that it was his rheumatoid arthritis.  Patient was seen at urgent care 2 days ago with the back pain as well as cough, nausea, vomiting.  He was diagnosed with flu and started on Tamiflu.  Patient has not been able to hold anything down for the last 3 to 4 days because of the vomiting.      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 uses Marijuana for severe pain.   Coronary artery disease    a. s/p STEMI 11/2009-->s/p CABG x 5: LIMA->LAD, VG->Diag, VG->OM2->OM3, VG->dRCA;  b. 05/23/2015 Inf STEMI/PCI: LM nl, LAD 65p/m, LCX 100, OM/2/3 small, RCA 75m, RPDA small, VG->dRCA 30p, VG->OM2->OM3 99 prox to OM2, 100 prox to OM3 (Rebel BMS's x 4), VG->Diag ok, LIMA->LAD ok.   Dyslipidemia    GERD (gastroesophageal reflux disease)    History of pulmonary embolism    years ago   Hypertension    Leukocytosis    referred to hematology   Lung cancer [facility])    Morbid  obesity ([place])    Rheumatoid arthritis [facility])     Patient Active Problem List   Diagnosis Date Noted   Solitary pulmonary nodule 10/09/2020   Hx of CABG x 5 2010 05/27/2015   Rheumatic disease 05/27/2015   STEMI inferior wall 05/23/15 05/23/2015   Chest pain 01/31/2014   Dyspnea 03/02/2012   LEUKOCYTOSIS 11/15/2010   EDEMA 10/01/2010   LOOSE BODY IN KNEE 09/18/2010   INTERNAL DERANGEMENT, LEFT KNEE 09/18/2010   TINEA PEDIS 07/27/2010   URINALYSIS, ABNORMAL 07/27/2010   ANEMIA, MILD 06/13/2010   KNEE PAIN, BILATERAL 06/11/2010   LEG PAIN 06/11/2010   Hyperlipidemia 03/27/2010   Essential hypertension, benign 01/26/2010   CAD S/P BMS to SVG-OM2-OM3 05/23/15 01/02/2010   Atrial fibrillation ([place]) 01/02/2010   TOBACCO USE, QUIT 01/02/2010   UNSPEC PERSONAL HX PRESENTING HAZARDS HEALTH 01/02/2010   HYPERTENSION 12/25/2009   GERD 12/25/2009    Past Surgical History:  Procedure Laterality Date   CARDIAC CATHETERIZATION N/A 05/23/2015   Procedure: Left Heart Cath and Coronary Angiography;  Surgeon: Belva Crome, MD;  Location: [place] CV LAB;  Service: Cardiovascular;  Laterality: N/A;   CARDIAC CATHETERIZATION N/A 05/23/2015   Procedure: Coronary Stent Intervention;  Surgeon: Mallie Mussel  Nicholes Stairs, MD;  Location: [place] CV LAB;  Service: Cardiovascular;  Laterality: N/A;   CORONARY ARTERY BYPASS GRAFT  11/30/2009   x5. LIMA to LAD, SVG to diagonal branch of LAD, SVG sequentially to OM2 and 3, SVG to distal RCA with EVH from the right leg    HEMORRHOID SURGERY         Family History  Problem Relation Age of Onset   Diabetes Mother    Heart attack Brother 68        died 04/16/2020 of cancer   Heart attack Brother 78    Social History   Tobacco Use   Smoking status: Some Days   Smokeless tobacco: Never  Substance Use Topics   Alcohol use: No    Alcohol/week: 0.0 standard drinks   Drug use: Yes    Types: Marijuana    Comment: occasionally uses marijuana    Home  Medications Prior to Admission medications   Medication Sig Start Date End Date Taking? Authorizing Provider  acetaminophen (TYLENOL) 325 MG tablet Take 2 tablets (650 mg total) by mouth every 4 (four) hours as needed for headache or mild pain. 05/27/15  Yes Kilroy, Luke K, PA-C  allopurinol (ZYLOPRIM) 100 MG tablet Take 100 mg by mouth daily.   Yes [provider]  aspirin 81 MG chewable tablet Chew 1 tablet (81 mg total) by mouth daily. 05/27/15  Yes Kilroy, Luke K, PA-C  atorvastatin (LIPITOR) 40 MG tablet Take 40 mg by mouth daily. 08/22/21  Yes [provider]  azithromycin (ZITHROMAX) 250 MG tablet Take 1 tablet (250 mg total) by mouth daily. Take first 2 tablets together, then 1 every day until finished. 12/07/21  Yes Francene Finders, PA-C  Calcium Carbonate-Vitamin D 600-200 MG-UNIT TABS Take 1 tablet by mouth daily.   Yes [provider]  Cholecalciferol (VITAMIN D-3) 1000 UNITS CAPS Take 1 capsule by mouth daily.   Yes [provider]  folic acid (FOLVITE) 1 MG tablet Take 1 mg by mouth daily.   Yes [provider]  hydrochlorothiazide (MICROZIDE) 12.5 MG capsule Take 12.5 mg by mouth daily.   Yes [provider]  isosorbide mononitrate (IMDUR) 30 MG 24 hr tablet Take 1 tablet (30 mg total) by mouth daily. Patient taking differently: Take 30 mg by mouth at bedtime. 01/31/14  Yes Ghimire, Henreitta Leber, MD  lisinopril (PRINIVIL,ZESTRIL) 40 MG tablet Take 40 mg by mouth daily.     Yes [provider]  methotrexate (RHEUMATREX) 2.5 MG tablet Take 15 mg by mouth every Wednesday.   Yes [provider]  metoprolol (LOPRESSOR) 50 MG tablet Take 25 mg by mouth 2 (two) times daily.   Yes [provider]  Multiple Vitamins-Minerals (PRESERVISION AREDS 2+MULTI VIT) CAPS Take 1 tablet by mouth daily.   Yes [provider]  nitroGLYCERIN (NITROSTAT) 0.4 MG SL tablet Place 1 tablet (0.4 mg total) under the tongue every 5  (five) minutes as needed for chest pain. 06/07/15  Yes Isaiah Serge, NP  ondansetron (ZOFRAN-ODT) 4 MG disintegrating tablet Take 1 tablet (4 mg total) by mouth every 8 (eight) hours as needed. 12/07/21  Yes Francene Finders, PA-C  predniSONE (DELTASONE) 20 MG tablet Take 2 tablets (40 mg total) by mouth daily with breakfast for 5 days. Patient taking differently: Take 40 mg by mouth See admin instructions. Qd x 5 days 12/07/21 12/12/21 Yes Francene Finders, PA-C  albuterol (PROVENTIL) (2.5 MG/3ML) 0.083% nebulizer solution Take  3 mLs (2.5 mg total) by nebulization every 4 (four) hours as needed for wheezing or shortness of breath. Patient not taking: Reported on 12/10/2021 09/06/17   Duffy Bruce, MD  oseltamivir (TAMIFLU) 75 MG capsule Take 1 capsule (75 mg total) by mouth every 12 (twelve) hours. Patient not taking: Reported on 12/10/2021 12/07/21   Francene Finders, PA-C    Allergies    Patient has no known allergies.  Review of Systems   Review of Systems  Constitutional:  Positive for fatigue.  Respiratory:  Positive for shortness of breath.   Gastrointestinal:  Positive for nausea and vomiting.  Musculoskeletal:  Positive for back pain.  All other systems reviewed and are negative.  Physical Exam Updated Vital Signs BP (!) 163/97    Pulse 86    Temp 97.8 F (36.6 C) (Oral)    Resp 16    Ht 5\' 7"  (1.702 m)    Wt 105.2 kg    SpO2 98%    BMI 36.34 kg/m   Physical Exam Vitals and nursing note reviewed.  Constitutional:      General: He is in acute distress.     Appearance: Normal appearance. He is well-developed. He is ill-appearing and diaphoretic.  HENT:     Head: Normocephalic and atraumatic.     Right Ear: Hearing normal.     Left Ear: Hearing normal.     Nose: Nose normal.  Eyes:     Conjunctiva/sclera: Conjunctivae normal.     Pupils: Pupils are equal, round, and reactive to light.  Cardiovascular:     Rate and Rhythm: Regular rhythm.     Heart sounds: S1 normal  and S2 normal. No murmur heard.   No friction rub. No gallop.  Pulmonary:     Effort: Pulmonary effort is normal. Tachypnea present. No respiratory distress.     Breath sounds: Decreased air movement present. Decreased breath sounds and wheezing present.  Chest:     Chest wall: No tenderness.  Abdominal:     General: Bowel sounds are normal.     Palpations: Abdomen is soft.     Tenderness: There is no abdominal tenderness. There is no guarding or rebound. Negative signs include Murphy's sign and McBurney's sign.     Hernia: No hernia is present.  Musculoskeletal:        General: Normal range of motion.     Cervical back: Normal range of motion and neck supple.     Lumbar back: Tenderness present.       Back:  Skin:    General: Skin is warm.     Findings: No rash.  Neurological:     Mental Status: He is alert and oriented to person, place, and time.     GCS: GCS eye subscore is 4. GCS verbal subscore is 5. GCS motor subscore is 6.     Cranial Nerves: No cranial nerve deficit.     Sensory: No sensory deficit.     Coordination: Coordination normal.  Psychiatric:        Speech: Speech normal.        Behavior: Behavior normal.        Thought Content: Thought content normal.    ED Results / Procedures / Treatments   Labs (all labs ordered are listed, but only abnormal results are displayed) Labs Reviewed  CBC WITH DIFFERENTIAL/PLATELET - Abnormal; Notable for the following components:      Result Value   WBC 90.3 (*)    Platelets  63 (*)    Neutro Abs 10.8 (*)    Lymphs Abs 9.0 (*)    Monocytes Absolute 21.7 (*)    Eosinophils Absolute 0.9 (*)    All other components within normal limits  COMPREHENSIVE METABOLIC PANEL - Abnormal; Notable for the following components:   Potassium 3.2 (*)    Glucose, Bld 165 (*)    BUN 62 (*)    Creatinine, Ser 3.43 (*)    Total Protein 6.2 (*)    Albumin 2.8 (*)    Alkaline Phosphatase 133 (*)    GFR, Estimated 18 (*)    All other  components within normal limits  LACTIC ACID, PLASMA - Abnormal; Notable for the following components:   Lactic Acid, Venous 3.2 (*)    All other components within normal limits  BRAIN NATRIURETIC PEPTIDE - Abnormal; Notable for the following components:   B Natriuretic Peptide 147.2 (*)    All other components within normal limits  DIC (DISSEMINATED INTRAVASCULAR COAGULATION)PANEL - Abnormal; Notable for the following components:   Prothrombin Time 18.4 (*)    INR 1.5 (*)    D-Dimer, Quant >20.00 (*)    Platelets 53 (*)    All other components within normal limits  I-STAT CHEM 8, ED - Abnormal; Notable for the following components:   BUN 69 (*)    Creatinine, Ser 3.20 (*)    Glucose, Bld 152 (*)    All other components within normal limits  TROPONIN I (HIGH SENSITIVITY) - Abnormal; Notable for the following components:   Troponin I (High Sensitivity) 387 (*)    All other components within normal limits  TROPONIN I (HIGH SENSITIVITY) - Abnormal; Notable for the following components:   Troponin I (High Sensitivity) 359 (*)    All other components within normal limits  RESP PANEL BY RT-PCR (FLU A&B, COVID) ARPGX2  CULTURE, BLOOD (SINGLE)  LIPASE, BLOOD  CK  URINALYSIS, ROUTINE W REFLEX MICROSCOPIC  PATHOLOGIST SMEAR REVIEW  HEPARIN LEVEL (UNFRACTIONATED)  CBC  LACTIC ACID, PLASMA  LACTATE DEHYDROGENASE  URIC ACID    EKG None  Radiology DG Chest Port 1 View  Result Date: 12/10/2021 CLINICAL DATA:  Chest pain EXAM: PORTABLE CHEST 1 VIEW COMPARISON:  09/06/2017, 09/15/2020 FINDINGS: Cardiac shadow is mildly prominent. Postsurgical changes are again seen. Linear density is noted radiating from the left hilum. This is likely related to scarring from patient's known left upper lobe mass lesion on prior PET-CT from 09/15/2020. No other focal infiltrate is seen. No bony abnormality is noted. IMPRESSION: Linear density radiating from the left hilum towards the pleural margin likely  related to the known left upper lobe hypermetabolic lesion seen on prior PET-CT. This may represent focal scarring from resection. Electronically Signed   By: Inez Catalina M.D.   On: 12/10/2021 00:52   CT ANGIO CHEST/ABD/PEL FOR DISSECTION W &/OR WO CONTRAST  Result Date: 12/10/2021 CLINICAL DATA:  Acute aortic syndrome EXAM: CT ANGIOGRAPHY CHEST, ABDOMEN AND PELVIS TECHNIQUE: Non-contrast CT of the chest was initially obtained. Multidetector CT imaging through the chest, abdomen and pelvis was performed using the standard protocol during bolus administration of intravenous contrast. Multiplanar reconstructed images and MIPs were obtained and reviewed to evaluate the vascular anatomy. CONTRAST:  164mL ISOVUE-370 IOPAMIDOL (ISOVUE-370) INJECTION 76% COMPARISON:  None. FINDINGS: CTA CHEST FINDINGS Cardiovascular: --Heart: The heart size is normal.  There is nopericardial effusion. --Aorta: The course and caliber of the thoracic aorta are normal. There is aortic atherosclerotic calcification. Precontrast images show  no aortic intramural hematoma. There is no blood pool, dissection or penetrating ulcer demonstrated on arterial phase postcontrast imaging. There is a conventional 3 vessel aortic arch branching pattern. The proximal arch vessels are widely patent. --Pulmonary Arteries: Contrast timing is optimized for preferential opacification of the aorta. Within that limitation, normal central pulmonary arteries. Mediastinum/Nodes: No mediastinal, hilar or axillary lymphadenopathy. The visualized thyroid and thoracic esophageal course are unremarkable. Lungs/Pleura: No pulmonary nodules or masses. No pleural effusion or pneumothorax. No focal airspace consolidation. No focal pleural abnormality. Musculoskeletal: No chest wall abnormality. No acute osseous findings. Review of the MIP images confirms the above findings. CTA ABDOMEN AND PELVIS FINDINGS VASCULAR Aorta: Normal caliber aorta without aneurysm, dissection,  vasculitis or hemodynamically significant stenosis. There is extensive aortic atherosclerosis. Celiac: No aneurysm, dissection or hemodynamically significant stenosis. Normal branching pattern. SMA: Widely patent without dissection or stenosis. Renals: Single renal arteries bilaterally. No aneurysm, dissection, stenosis or evidence of fibromuscular dysplasia. IMA: Patent without abnormality. Inflow: No aneurysm, stenosis or dissection. Veins: Normal course and caliber of the major veins. Assessment is otherwise limited by the arterial dominant contrast phase. Review of the MIP images confirms the above findings. NON-VASCULAR Hepatobiliary: Normal hepatic contours and density. No visible biliary dilatation. Cholelithiasis without acute inflammation. Pancreas: Normal contours without ductal dilatation. No peripancreatic fluid collection. Spleen: Normal arterial phase splenic enhancement pattern. Adrenals/Urinary Tract: --Adrenal glands: There is a low-density left adrenal nodule measuring up to 2.5 cm, likely adenoma. --Right kidney/ureter: Moderate perinephric stranding without hydronephrosis --Left kidney/ureter: Moderate perinephric stranding without hydronephrosis --Urinary bladder: Unremarkable. Stomach/Bowel: --Stomach/Duodenum: No hiatal hernia or other gastric abnormality. Normal duodenal course and caliber. --Small bowel: No dilatation or inflammation. --Colon: Rectosigmoid diverticulosis without acute inflammation. --Appendix: Normal. Lymphatic:  No abdominal or pelvic lymphadenopathy. Reproductive: Normal prostate and seminal vesicles. Musculoskeletal. No bony spinal canal stenosis or focal osseous abnormality. Other: None. Review of the MIP images confirms the above findings. IMPRESSION: 1. No acute aortic syndrome or other acute abnormality of the chest, abdomen or pelvis. Aortic Atherosclerosis (ICD10-I70.0). Electronically Signed   By: Ulyses Jarred M.D.   On: 12/10/2021 02:13    Procedures .Critical  Care Performed by: Orpah Greek, MD Authorized by: Orpah Greek, MD   Critical care provider statement:    Critical care time (minutes):  60   Critical care time was exclusive of:  Separately billable procedures and treating other patients   Critical care was necessary to treat or prevent imminent or life-threatening deterioration of the following conditions:  Sepsis and metabolic crisis   Critical care was time spent personally by me on the following activities:  Ordering and performing treatments and interventions, ordering and review of laboratory studies, development of treatment plan with patient or surrogate, discussions with consultants, ordering and review of radiographic studies, pulse oximetry, evaluation of patient's response to treatment, re-evaluation of patient's condition, review of old charts, examination of patient and obtaining history from patient or surrogate   I assumed direction of critical care for this patient from another provider in my specialty: no     Care discussed with: admitting provider and accepting provider at another facility     Medications Ordered in ED Medications  lactated ringers infusion ( Intravenous New Bag/Given 12/10/21 0135)  lactated ringers bolus 1,000 mL (has no administration in time range)  labetalol (NORMODYNE) injection 10 mg (has no administration in time range)  HYDROmorphone (DILAUDID) injection 1 mg (1 mg Intravenous Given 12/09/21 2351)  ondansetron (ZOFRAN) injection 4  mg (4 mg Intravenous Given 12/09/21 2353)  lactated ringers bolus 1,000 mL (0 mLs Intravenous Stopped 12/10/21 0200)    And  lactated ringers bolus 1,000 mL (0 mLs Intravenous Stopped 12/10/21 0202)    And  lactated ringers bolus 1,000 mL (0 mLs Intravenous Stopped 12/10/21 0249)    And  lactated ringers bolus 500 mL (0 mLs Intravenous Stopped 12/10/21 0452)  ceFEPIme (MAXIPIME) 2 g in sodium chloride 0.9 % 100 mL IVPB (0 g Intravenous Stopped  12/10/21 0206)  metroNIDAZOLE (FLAGYL) IVPB 500 mg (0 mg Intravenous Stopped 12/10/21 0322)  vancomycin (VANCOREADY) IVPB 2000 mg/400 mL (0 mg Intravenous Stopped 12/10/21 0452)  iopamidol (ISOVUE-370) 76 % injection 100 mL (100 mLs Intravenous Contrast Given 12/10/21 0126)  HYDROmorphone (DILAUDID) injection 1 mg (1 mg Intravenous Given 12/10/21 0328)  heparin bolus via infusion 4,000 Units (4,000 Units Intravenous Bolus from Bag 12/10/21 0334)    ED Course  I have reviewed the triage vital signs and the nursing notes.  Pertinent labs & imaging results that were available during my care of the patient were reviewed by me and considered in my medical decision making (see chart for details).    MDM Rules/Calculators/A&P                         Patient since with multiple complaints.  He has been experiencing back pain for more than a week and now has had cough, chest congestion, vomiting for several days.  He is currently being treated for the flu.  Patient came in tonight because of severe low back pain as well as chest pain.  He does have a cardiac history.  EKG shows nonspecific interventricular delay, no obvious signs of ST elevation MI.  EKG is changed from prior but our last comparison is from 2018.  He has had 2 heart attacks in the past.  Patient ill-appearing upon arrival.  He is diaphoretic and very uncomfortable.  Constellation of symptoms is concerning for aortic dissection.  I-STAT Chem-8 reveals acute kidney injury.  Baseline creatinine is 1.5-1.7.  This was confirmed by review of labs from 2 weeks ago.  Creatinine is now 3.2.  This is likely secondary to nausea and vomiting over the last 4 days.   I discussed CT angiography with the patient.  Risks and benefits were discussed including the possibility of needing hemodialysis if the study was performed but death if dissection was missed.  He asked me to discuss this with his daughter, Benjamine Mola.  I did call her on her cell phone and  extensively discussed my concerns, his lab abnormalities, his presentation and exam and the risks and benefits of CT.  She did consent for the procedure.  Patient has a white count of 90.  This seems high for infectious reaction.  I would be concerned about primary leukemia.  He also has thrombocytopenia.  His white count 2 weeks ago was in the 18 range and his platelets were 120s.  This is an acute change.  With his presentation and vital signs, will consider sepsis and treat empirically with broad-spectrum antibiotics.  Patient given a 30 mL/kg fluid bolus to help protect his kidneys prior to CT angiography.  CT angiography has been performed, no evidence of dissection or other acute pathology noted.  Discussed with Dr. Paticia Stack, on-call for cardiology.  He has reviewed the EKG, agrees it is an atypical right bundle branch block without ST elevations or obvious ischemia.  Elevation of  troponin is nonspecific at this time, likely troponin leak secondary to general medical condition.  Does not require urgent cardiology intervention.  We will initiate heparinization and trend troponins.  Patient has already been administered broad-spectrum antibiotics for elevated lactic acid with significant leukocytosis.  No obvious source of fever noted.  Discussed with Dr. Bobby Rumpf, on-call for critical care.  He has reviewed the chart and does not feel the patient requires ICU admission.  Discussed with Dr. Marlowe Sax, on-call for the hospitalist service.  She expresses concern about the possibility of acute leukemia and need for acute treatment.  I did discuss this with Dr. Earlie Server, who is on-call for hematology/oncology.  He indicates that the diagnosis of acute leukemia is high and that these patients need to be referred to a tertiary care center, recommends calling [facility].  Discussed with Dr. Lissa Merlin, leukemia service at [facility].  Patient has been accepted for transfer for admission and further  management.  PATIEN'T DAUGHTER, ELIZABETH ARCHER, HAS BEEN UPDATED MULTIPLE TIMES OF PATIENT'S RESULTS, CONDITION AND DISPOSITION TO [facility]. HER PHONE NUMBER IS [phone]     Final Clinical Impression(s) / ED Diagnoses Final diagnoses:  Leukocytosis, unspecified type  Acute midline low back pain without sciatica  Chest pain, unspecified type    Rx / DC Orders ED Discharge Orders     None        Orpah Greek, MD 12/10/21 [phone]

## 2021-12-11 LAB — PATHOLOGIST SMEAR REVIEW

## 2021-12-15 LAB — CULTURE, BLOOD (SINGLE)
Culture: NO GROWTH
Special Requests: ADEQUATE

## 2021-12-16 DEATH — deceased
